# Patient Record
Sex: Female | Born: 1990 | Race: White | Hispanic: No | Marital: Married | Smoking: Never smoker
Health system: Southern US, Community
[De-identification: ages and names within clinical notes are randomized; demographics above are authoritative.]

## PROBLEM LIST (undated history)

## (undated) ENCOUNTER — Inpatient Hospital Stay (HOSPITAL_COMMUNITY): Payer: Self-pay

## (undated) DIAGNOSIS — K589 Irritable bowel syndrome without diarrhea: Secondary | ICD-10-CM

## (undated) DIAGNOSIS — N809 Endometriosis, unspecified: Secondary | ICD-10-CM

## (undated) DIAGNOSIS — E7212 Methylenetetrahydrofolate reductase deficiency: Secondary | ICD-10-CM

## (undated) DIAGNOSIS — E039 Hypothyroidism, unspecified: Secondary | ICD-10-CM

## (undated) DIAGNOSIS — A6920 Lyme disease, unspecified: Secondary | ICD-10-CM

## (undated) DIAGNOSIS — Z1589 Genetic susceptibility to other disease: Secondary | ICD-10-CM

## (undated) DIAGNOSIS — L709 Acne, unspecified: Secondary | ICD-10-CM

## (undated) HISTORY — DX: Acne, unspecified: L70.9

## (undated) HISTORY — PX: OTHER SURGICAL HISTORY: SHX169

## (undated) HISTORY — DX: Endometriosis, unspecified: N80.9

## (undated) HISTORY — DX: Hypothyroidism, unspecified: E03.9

## (undated) HISTORY — PX: APPENDECTOMY: SHX54

## (undated) HISTORY — DX: Irritable bowel syndrome, unspecified: K58.9

## (undated) HISTORY — PX: TONSILLECTOMY: SUR1361

## (undated) HISTORY — PX: CHOLECYSTECTOMY: SHX55

---

## 2011-01-14 ENCOUNTER — Other Ambulatory Visit: Payer: Self-pay | Admitting: Gastroenterology

## 2011-01-18 ENCOUNTER — Ambulatory Visit
Admission: RE | Admit: 2011-01-18 | Discharge: 2011-01-18 | Disposition: A | Discharge status: Home / Self Care | Payer: BC Managed Care – PPO | Source: Ambulatory Visit | Attending: Gastroenterology | Admitting: Gastroenterology

## 2011-01-18 MED ORDER — IOHEXOL 300 MG/ML  SOLN
100.0000 mL | Freq: Once | INTRAMUSCULAR | Status: AC | PRN
  Administered 2011-01-18: 100 mL via INTRAVENOUS

## 2012-01-13 ENCOUNTER — Emergency Department (HOSPITAL_COMMUNITY): Payer: BC Managed Care – PPO

## 2012-01-13 ENCOUNTER — Emergency Department (HOSPITAL_COMMUNITY)
Admission: EM | Admit: 2012-01-13 | Discharge: 2012-01-13 | Disposition: A | Discharge status: Home Health | Payer: BC Managed Care – PPO | Attending: Emergency Medicine | Admitting: Emergency Medicine

## 2012-01-13 ENCOUNTER — Encounter (HOSPITAL_COMMUNITY): Payer: Self-pay | Admitting: Emergency Medicine

## 2012-01-13 DIAGNOSIS — R112 Nausea with vomiting, unspecified: Secondary | ICD-10-CM | POA: Insufficient documentation

## 2012-01-13 DIAGNOSIS — R1011 Right upper quadrant pain: Secondary | ICD-10-CM | POA: Insufficient documentation

## 2012-01-13 DIAGNOSIS — N390 Urinary tract infection, site not specified: Secondary | ICD-10-CM

## 2012-01-13 DIAGNOSIS — R109 Unspecified abdominal pain: Secondary | ICD-10-CM | POA: Insufficient documentation

## 2012-01-13 DIAGNOSIS — N200 Calculus of kidney: Secondary | ICD-10-CM

## 2012-01-13 LAB — DIFFERENTIAL
Basophils Relative: 0 % (ref 0–1)
Eosinophils Absolute: 0 10*3/uL (ref 0.0–0.7)
Eosinophils Relative: 0 % (ref 0–5)
Lymphs Abs: 2.2 10*3/uL (ref 0.7–4.0)
Monocytes Absolute: 0.7 10*3/uL (ref 0.1–1.0)
Monocytes Relative: 7 % (ref 3–12)
Neutrophils Relative %: 72 % (ref 43–77)

## 2012-01-13 LAB — COMPREHENSIVE METABOLIC PANEL
Alkaline Phosphatase: 91 U/L (ref 39–117)
BUN: 8 mg/dL (ref 6–23)
Creatinine, Ser: 0.72 mg/dL (ref 0.50–1.10)
GFR calc Af Amer: >90 mL/min (ref >90)
Glucose, Bld: 147 mg/dL — ABNORMAL HIGH (ref 70–99)
Potassium: 3.4 mEq/L — ABNORMAL LOW (ref 3.5–5.1)
Total Bilirubin: 0.7 mg/dL (ref 0.3–1.2)
Total Protein: 7.4 g/dL (ref 6.0–8.3)

## 2012-01-13 LAB — URINALYSIS, ROUTINE W REFLEX MICROSCOPIC
Glucose, UA: NEGATIVE mg/dL
Protein, ur: NEGATIVE mg/dL
Specific Gravity, Urine: 1.023 (ref 1.005–1.030)
Urobilinogen, UA: 0.2 mg/dL (ref 0.0–1.0)

## 2012-01-13 LAB — LIPASE, BLOOD: Lipase: 36 U/L (ref 11–59)

## 2012-01-13 LAB — CBC
HCT: 39.2 % (ref 36.0–46.0)
Hemoglobin: 13.4 g/dL (ref 12.0–15.0)
MCH: 29.8 pg (ref 26.0–34.0)
MCHC: 34.2 g/dL (ref 30.0–36.0)
MCV: 87.1 fL (ref 78.0–100.0)
RBC: 4.5 MIL/uL (ref 3.87–5.11)

## 2012-01-13 LAB — URINE MICROSCOPIC-ADD ON

## 2012-01-13 LAB — PREGNANCY, URINE: Preg Test, Ur: NEGATIVE

## 2012-01-13 MED ORDER — SODIUM CHLORIDE 0.9 % IV SOLN
1000.0000 mL | Freq: Once | INTRAVENOUS | Status: AC
  Administered 2012-01-13: 1000 mL via INTRAVENOUS

## 2012-01-13 MED ORDER — HYDROCODONE-ACETAMINOPHEN 5-325 MG PO TABS: 1.0000 | ORAL_TABLET | Freq: Four times a day (QID) | ORAL | Status: AC | PRN

## 2012-01-13 MED ORDER — CIPROFLOXACIN IN D5W 400 MG/200ML IV SOLN
400.0000 mg | Freq: Two times a day (BID) | INTRAVENOUS | Status: DC
  Administered 2012-01-13: 400 mg via INTRAVENOUS
  Filled 2012-01-13: qty 200

## 2012-01-13 MED ORDER — SODIUM CHLORIDE 0.9 % IV SOLN
1000.0000 mL | INTRAVENOUS | Status: DC
  Administered 2012-01-13: 1000 mL via INTRAVENOUS

## 2012-01-13 MED ORDER — ONDANSETRON HCL 4 MG/2ML IJ SOLN
4.0000 mg | Freq: Once | INTRAMUSCULAR | Status: AC
  Administered 2012-01-13: 4 mg via INTRAVENOUS
  Filled 2012-01-13: qty 2

## 2012-01-13 MED ORDER — ONDANSETRON 8 MG PO TBDP: 8.0000 mg | ORAL_TABLET | Freq: Three times a day (TID) | ORAL | Status: AC | PRN

## 2012-01-13 MED ORDER — CIPROFLOXACIN HCL 500 MG PO TABS: 500.0000 mg | ORAL_TABLET | Freq: Two times a day (BID) | ORAL | Status: AC

## 2012-01-13 MED ORDER — KETOROLAC TROMETHAMINE 30 MG/ML IJ SOLN
30.0000 mg | Freq: Once | INTRAMUSCULAR | Status: AC
  Administered 2012-01-13: 30 mg via INTRAVENOUS
  Filled 2012-01-13: qty 1

## 2012-01-13 MED ORDER — HYDROMORPHONE HCL PF 1 MG/ML IJ SOLN
1.0000 mg | Freq: Once | INTRAMUSCULAR | Status: AC
Start: 2012-01-13 — End: 2012-01-13
  Administered 2012-01-13: 1 mg via INTRAVENOUS
  Filled 2012-01-13: qty 1

## 2012-01-13 NOTE — Discharge Instructions (Signed)
Kidney Stones Kidney stones (ureteral lithiasis) are deposits that form inside your kidneys. The intense pain is caused by the stone moving through the urinary tract. When the stone moves, the ureter goes into spasm around the stone. The stone is usually passed in the urine.  CAUSES   A disorder that makes certain neck glands produce too much parathyroid hormone (primary hyperparathyroidism).   A buildup of uric acid crystals.   Narrowing (stricture) of the ureter.   A kidney obstruction present at birth (congenital obstruction).   Previous surgery on the kidney or ureters.   Numerous kidney infections.  SYMPTOMS   Feeling sick to your stomach (nauseous).   Throwing up (vomiting).   Blood in the urine (hematuria).   Pain that usually spreads (radiates) to the groin.   Frequency or urgency of urination.  DIAGNOSIS   Taking a history and physical exam.   Blood or urine tests.   Computerized X-ray scan (CT scan).   Occasionally, an examination of the inside of the urinary bladder (cystoscopy) is performed.  TREATMENT   Observation.   Increasing your fluid intake.   Surgery may be needed if you have severe pain or persistent obstruction.  The size, location, and chemical composition are all important variables that will determine the proper choice of action for you. Talk to your caregiver to better understand your situation so that you will minimize the risk of injury to yourself and your kidney.  HOME CARE INSTRUCTIONS   Drink enough water and fluids to keep your urine clear or pale yellow.   Strain all urine through the provided strainer. Keep all particulate matter and stones for your caregiver to see. The stone causing the pain may be as small as a grain of salt. It is very important to use the strainer each and every time you pass your urine. The collection of your stone will allow your caregiver to analyze it and verify that a stone has actually passed.   Only take  over-the-counter or prescription medicines for pain, discomfort, or fever as directed by your caregiver.   Make a follow-up appointment with your caregiver as directed.   Get follow-up X-rays if required. The absence of pain does not always mean that the stone has passed. It may have only stopped moving. If the urine remains completely obstructed, it can cause loss of kidney function or even complete destruction of the kidney. It is your responsibility to make sure X-rays and follow-ups are completed. Ultrasounds of the kidney can show blockages and the status of the kidney. Ultrasounds are not associated with any radiation and can be performed easily in a matter of minutes.  SEEK IMMEDIATE MEDICAL CARE IF:   Pain cannot be controlled with the prescribed medicine.   You have a fever.   The severity or intensity of pain increases over 18 hours and is not relieved by pain medicine.   You develop a new onset of abdominal pain.   You feel faint or pass out.  MAKE SURE YOU:   Understand these instructions.   Will watch your condition.   Will get help right away if you are not doing well or get worse.  Document Released: 11/07/2005 Document Revised: 07/20/2011 Document Reviewed: 03/05/2010 [facility] Patient Information 2012 [place], [place].Urinary Tract Infection Infections of the urinary tract can start in several places. A bladder infection (cystitis), a kidney infection (pyelonephritis), and a prostate infection (prostatitis) are different types of urinary tract infections (UTIs). They usually get better if  treated with medicines (antibiotics) that kill germs. Take all the medicine until it is gone. You or your child may feel better in a few days, but TAKE ALL MEDICINE or the infection may not respond and may become more difficult to treat. HOME CARE INSTRUCTIONS   Drink enough water and fluids to keep the urine clear or pale yellow. Cranberry juice is especially recommended, in addition to  large amounts of water.   Avoid caffeine, tea, and carbonated beverages. They tend to irritate the bladder.   Alcohol may irritate the prostate.   Only take over-the-counter or prescription medicines for pain, discomfort, or fever as directed by your caregiver.  To prevent further infections:  Empty the bladder often. Avoid holding urine for long periods of time.   After a bowel movement, women should cleanse from front to back. Use each tissue only once.   Empty the bladder before and after sexual intercourse.  FINDING OUT THE RESULTS OF YOUR TEST Not all test results are available during your visit. If your or your child's test results are not back during the visit, make an appointment with your caregiver to find out the results. Do not assume everything is normal if you have not heard from your caregiver or the medical facility. It is important for you to follow up on all test results. SEEK MEDICAL CARE IF:   There is back pain.   Your baby is older than 3 months with a rectal temperature of 100.5 F (38.1 C) or higher for more than 1 day.   Your or your child's problems (symptoms) are no better in 3 days. Return sooner if you or your child is getting worse.  SEEK IMMEDIATE MEDICAL CARE IF:   There is severe back pain or lower abdominal pain.   You or your child develops chills.   You have a fever.   Your baby is older than 3 months with a rectal temperature of 102 F (38.9 C) or higher.   Your baby is 70 months old or younger with a rectal temperature of 100.4 F (38 C) or higher.   There is nausea or vomiting.   There is continued burning or discomfort with urination.  MAKE SURE YOU:   Understand these instructions.   Will watch your condition.   Will get help right away if you are not doing well or get worse.  Document Released: 08/17/2005 Document Revised: 07/20/2011 Document Reviewed: 03/22/2007 [facility] Patient Information 2012 [place], [place].

## 2012-01-13 NOTE — ED Provider Notes (Signed)
History     CSN: [id number]  Arrival date & time 01/13/12  7829   First MD Initiated Contact with Patient 01/13/12 0535      Chief Complaint  Patient presents with  . Abdominal Pain    (Consider location/radiation/quality/duration/timing/severity/associated sxs/prior treatment) HPI Patient presents to the emergency room with complaints of severe right flank pain. Patient states it started suddenly about 1 AM. She has been having nausea and vomiting associated with it. Pain radiates towards her right lower abdomen. She denies any diarrhea or constipation. She denies any dysuria, vaginal bleeding or discharge. The patient denies history of kidney stones. She does have history of a cholecystectomy in the past. Patient denies any recent injuries. She denies any chest for shortness of breath. The pain seems to be worse when she tries to lie flat. No past medical history on file.  Past Surgical History  Procedure Date  . Tonsillectomy   . Cholecystectomy     No family history on file.  History  Substance Use Topics  . Smoking status: Not on file  . Smokeless tobacco: Not on file  . Alcohol Use:     OB History    Grav Para Term Preterm Abortions TAB SAB Ect Mult Living                  Review of Systems  All other systems reviewed and are negative.    Allergies  Bactrim; Keflex; Penicillins; and Zithromax  Home Medications  No current outpatient prescriptions on file.  BP 130/94  Pulse 72  Resp 18  SpO2 95%  LMP 01/06/2012  Physical Exam  Nursing note and vitals reviewed. Constitutional: She appears well-developed and well-nourished. She appears distressed.  HENT:  Head: Normocephalic and atraumatic.  Right Ear: External ear normal.  Left Ear: External ear normal.  Eyes: Conjunctivae are normal. Right eye exhibits no discharge. Left eye exhibits no discharge. No scleral icterus.  Neck: Neck supple. No tracheal deviation present.  Cardiovascular: Normal rate,  regular rhythm and intact distal pulses.   Pulmonary/Chest: Effort normal and breath sounds normal. No stridor. No respiratory distress. She has no wheezes. She has no rales.  Abdominal: Soft. Bowel sounds are normal. She exhibits no distension. There is tenderness in the right upper quadrant. There is CVA tenderness. There is no rigidity, no rebound, no guarding and negative Murphy's sign. No hernia.  Musculoskeletal: She exhibits no edema and no tenderness.  Neurological: She is alert. She has normal strength. No sensory deficit. Cranial nerve deficit:  no gross defecits noted. She exhibits normal muscle tone. She displays no seizure activity. Coordination normal.  Skin: Skin is warm and dry. No rash noted.  Psychiatric: She has a normal mood and affect.    ED Course  Procedures (including critical care time)  Medications  0.9 %  sodium chloride infusion (1000 mL Intravenous New Bag/Given 01/13/12 0553)    Followed by  0.9 %  sodium chloride infusion (1000 mL Intravenous New Bag/Given 01/13/12 0622)  Multiple Vitamin (MULITIVITAMIN WITH MINERALS) TABS (not administered)  ciprofloxacin (CIPRO) IVPB 400 mg (400 mg Intravenous Given 01/13/12 0640)  ketorolac (TORADOL) 30 MG/ML injection 30 mg (not administered)  HYDROcodone-acetaminophen (NORCO) 5-325 MG per tablet (not administered)  ondansetron (ZOFRAN ODT) 8 MG disintegrating tablet (not administered)  ciprofloxacin (CIPRO) 500 MG tablet (not administered)  HYDROmorphone (DILAUDID) injection 1 mg (1 mg Intravenous Given 01/13/12 0553)  ondansetron (ZOFRAN) injection 4 mg (4 mg Intravenous Given 01/13/12 0556)  Labs Reviewed  URINALYSIS, ROUTINE W REFLEX MICROSCOPIC - Abnormal; Notable for the following:    APPearance CLOUDY (*)    Hgb urine dipstick SMALL (*)    Leukocytes, UA LARGE (*)    All other components within normal limits  COMPREHENSIVE METABOLIC PANEL - Abnormal; Notable for the following:    Potassium 3.4 (*)    Glucose,  Bld 147 (*)    All other components within normal limits  URINE MICROSCOPIC-ADD ON - Abnormal; Notable for the following:    Squamous Epithelial / LPF MANY (*)    Bacteria, UA MANY (*)    All other components within normal limits  CBC  DIFFERENTIAL  LIPASE, BLOOD  PREGNANCY, URINE  URINE CULTURE   Ct Abdomen Pelvis Wo Contrast  01/13/2012  *RADIOLOGY REPORT*  Clinical Data:  Right-sided flank pain.  White blood cells and red blood cells in urine.  Cholecystectomy and 2 laparoscopies for endometriosis.  CT ABDOMEN AND PELVIS WITHOUT CONTRAST (CT UROGRAM)  Technique: Contiguous axial images of the abdomen and pelvis without oral or intravenous contrast were obtained.  Comparison: 01/18/2011  Findings:  Exam is limited for evaluation of entities other than urinary tract calculi due to lack of oral or intravenous contrast.   Clear lung bases.  Normal heart size without pericardial or pleural effusion.  Moderate pectus excavatum deformity.  Normal uninfused appearance of the liver, spleen, stomach, pancreas. Cholecystectomy without biliary ductal dilatation.  Normal adrenal glands.  No renal calculi.  There is mild right renal edema and minimal right hydroureter.  No ureteric stone is identified.  No retroperitoneal or retrocrural adenopathy.  Moderate stool within the rectum.  Stool filled terminal ileum.  Normal appendix. Normal small bowel without abdominal ascites.    No pelvic adenopathy.  2 mm stone in the left sided dependent bladder on image 78.  Cervical birth control device.  No adnexal mass or significant free pelvic fluid. No acute osseous abnormality.  IMPRESSION: 2 ml stone in the dependent left bladder.  Given right renal edema and mild right hydroureter, likely recently passed from the right urinary tract.  Possible constipation.  Original Report Authenticated By: Consuello Bossier, M.D.     1. Kidney stone   2. UTI (lower urinary tract infection)       MDM  The patient is more  comfortable at this time after pain medications and antiemetics. Urinalysis suggests urinary tract infection although she does have squamous epithelial cells in the urine sample.. The patient however had very colicky abdominal pain. Symptoms are suspicious for kidney stones. I ordered a CT abdomen and pelvis non-contrast.  If negative I will plan on discharging the patient home on pain medications and antibiotics.  CT scan suggests the patient recently passed a right ureteral stone. This would be consistent with the symptoms that she had. Findings have been discussed with the patient and her family. She'll be discharged home her on a course of antibiotics and pain medication      Celene Kras, MD 01/13/12 [id number]

## 2012-01-13 NOTE — ED Notes (Signed)
MD at bedside. 

## 2012-01-13 NOTE — ED Notes (Signed)
Pt reports sudden right flank pain that started suddenly at 1am. Pt reports nausea and vomiting and rates pain 10/10.

## 2012-01-15 LAB — URINE CULTURE: Culture  Setup Time: 201302221025

## 2012-01-16 NOTE — ED Notes (Signed)
+   urine Patient treated with Cipro-sensitive to same-chart appended per protocol MD. 

## 2012-08-25 IMAGING — CT CT ABD-PELV W/O CM
1 series · 13 of 15 positions shown, 19 images · non-contrast
Comparison: 01/18/2011

CLINICAL DATA: Right-sided flank pain.  White blood cells and red
blood cells in urine.  Cholecystectomy and 2 laparoscopies for
endometriosis.

CT ABDOMEN AND PELVIS WITHOUT CONTRAST (CT UROGRAM)
TECHNIQUE: Contiguous axial images of the abdomen and pelvis
without oral or intravenous contrast were obtained.

[Series 4: lung · axial · 0.74mm/px · z∈[+146,+206]mm · 13 of 15 slices shown, 19 images]
[im 2/15  soft-tissue]
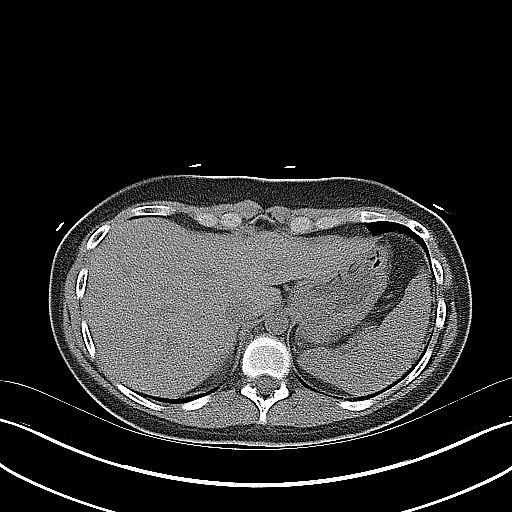
[im 2/15  bone]
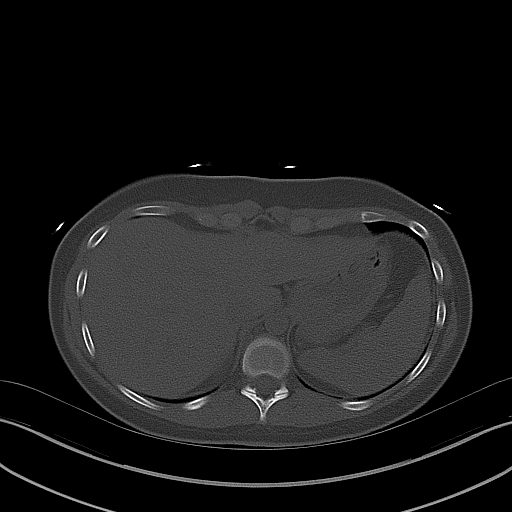
[im 3/15  soft-tissue]
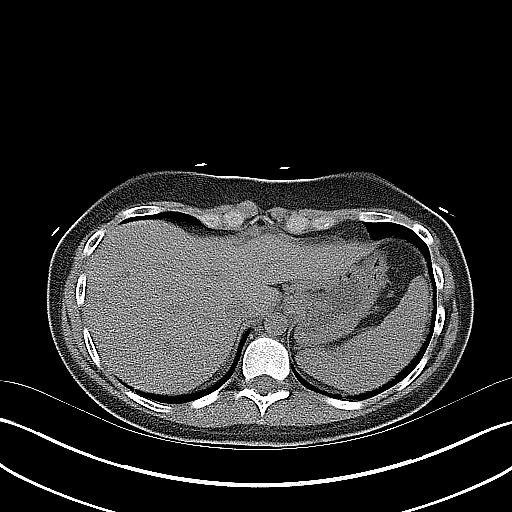
[im 4/15  soft-tissue]
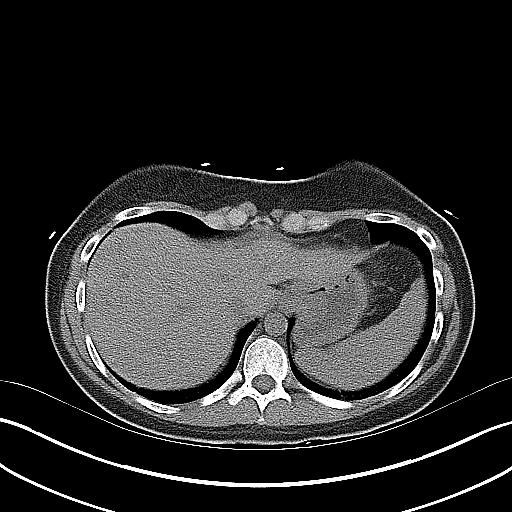
[im 5/15  soft-tissue]
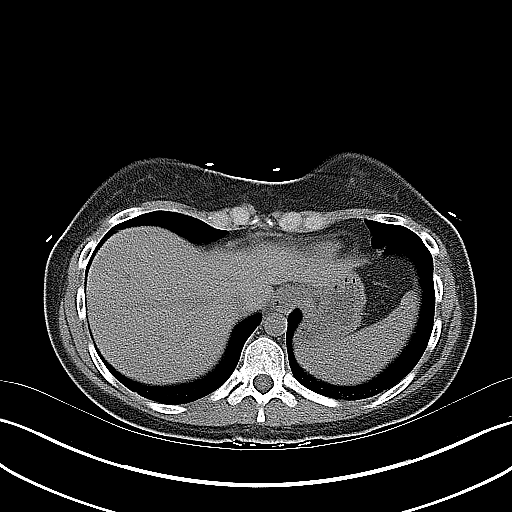
[im 6/15  soft-tissue]
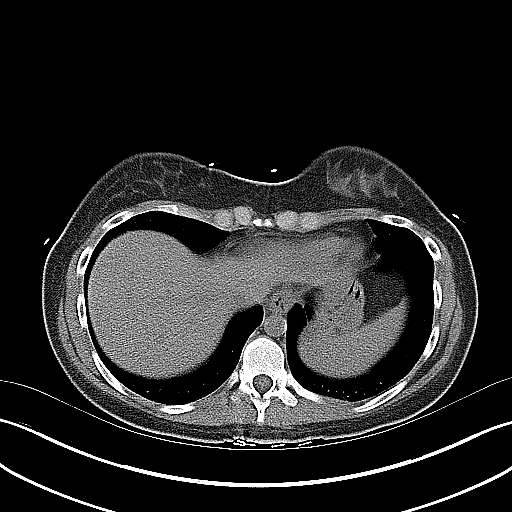
[im 7/15  soft-tissue]
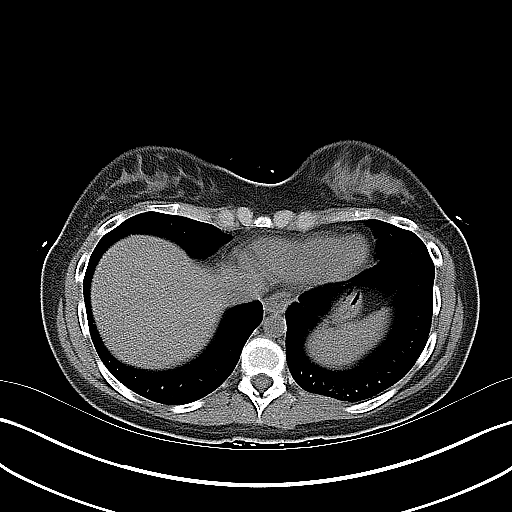
[im 8/15  soft-tissue]
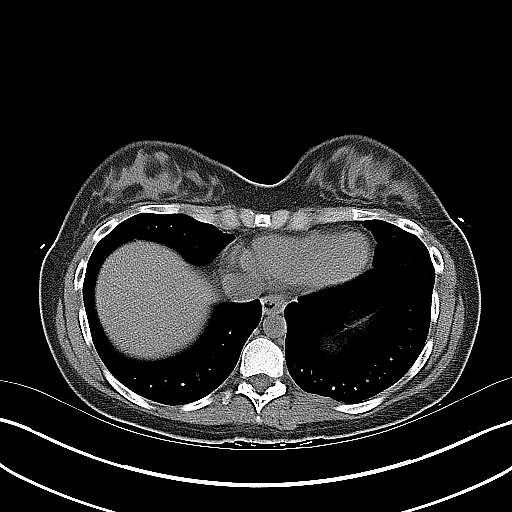
[im 9/15  soft-tissue]
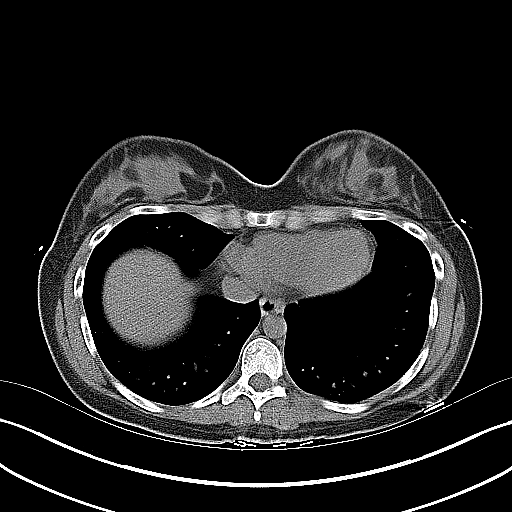
[im 10/15  soft-tissue]
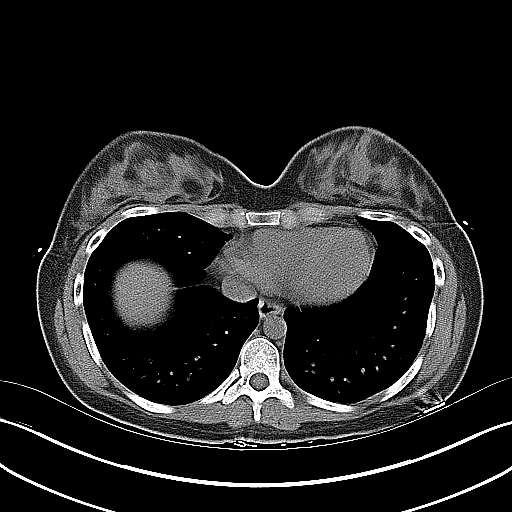
[im 10/15  bone]
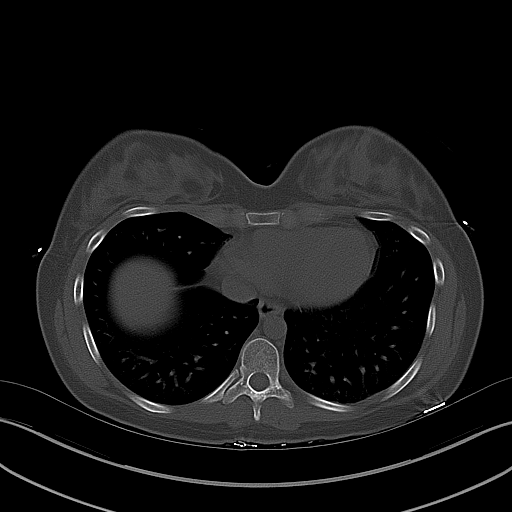
[im 11/15  soft-tissue]
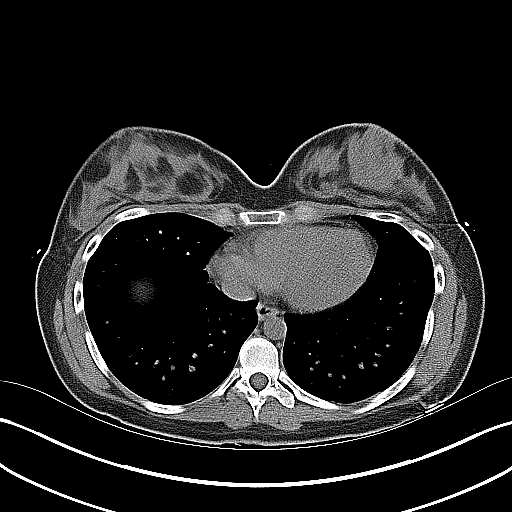
[im 11/15  lung]
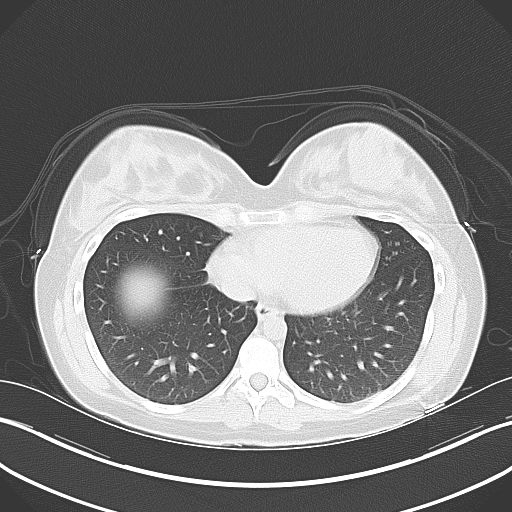
[im 12/15  soft-tissue]
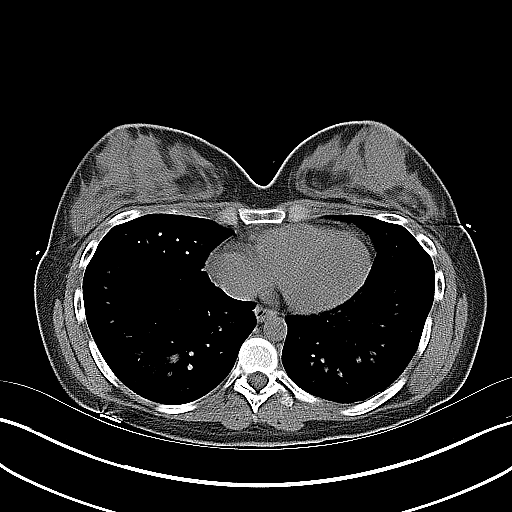
[im 12/15  lung]
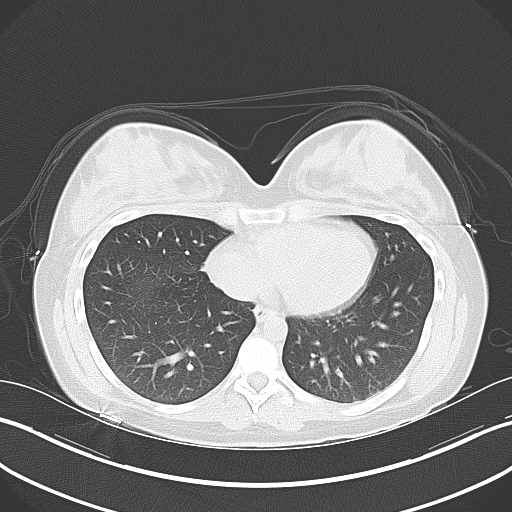
[im 13/15  soft-tissue]
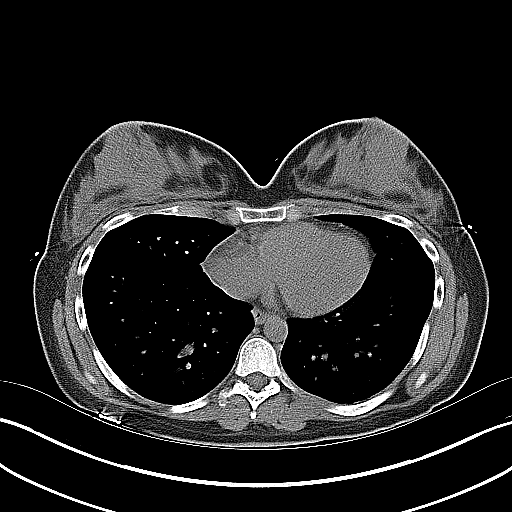
[im 13/15  lung]
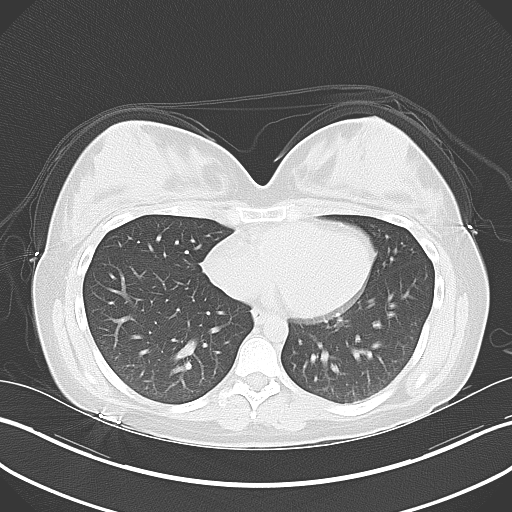
[im 14/15  soft-tissue]
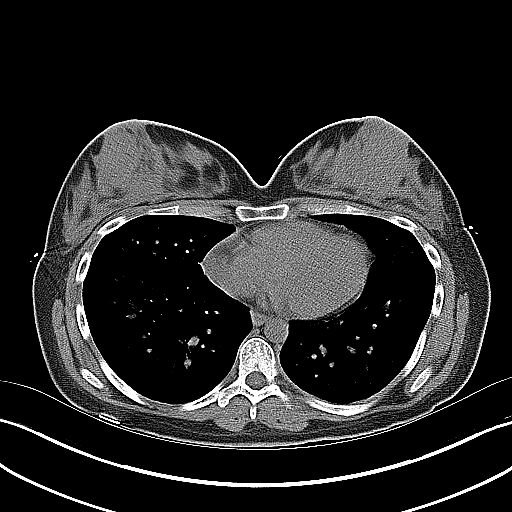
[im 14/15  lung]
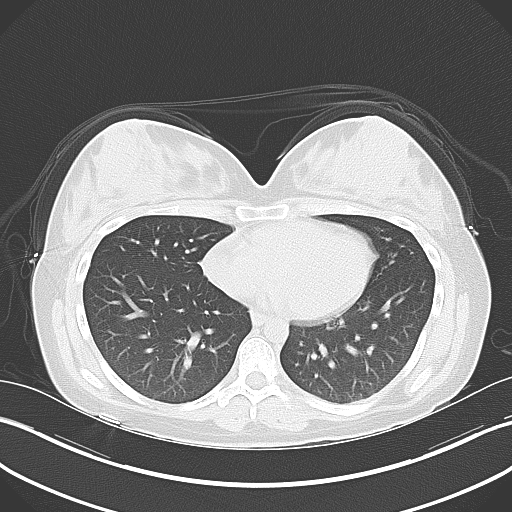

[13 of 15 positions shown; findings below may reference images not displayed]

FINDINGS: Exam is limited for evaluation of entities other than
urinary tract calculi due to lack of oral or intravenous contrast.

 Clear lung bases.  Normal heart size without pericardial or
pleural effusion.  Moderate pectus excavatum deformity.  Normal
uninfused appearance of the liver, spleen, stomach, pancreas.
Cholecystectomy without biliary ductal dilatation.  Normal adrenal
glands.  No renal calculi.  There is mild right renal edema and
minimal right hydroureter.  No ureteric stone is identified.

No retroperitoneal or retrocrural adenopathy.  Moderate stool
within the rectum.  Stool filled terminal ileum.  Normal appendix.
Normal small bowel without abdominal ascites.

  No pelvic adenopathy.  2 mm stone in the left sided dependent
bladder on image 78.  Cervical birth control device.  No adnexal
mass or significant free pelvic fluid. No acute osseous
abnormality.
IMPRESSION: 2 ml stone in the dependent left bladder.  Given right renal edema
and mild right hydroureter, likely recently passed from the right
urinary tract.

Possible constipation.

## 2013-01-22 ENCOUNTER — Other Ambulatory Visit (HOSPITAL_COMMUNITY)
Admission: RE | Admit: 2013-01-22 | Discharge: 2013-01-22 | Disposition: A | Discharge status: Home / Self Care | Payer: BC Managed Care – PPO | Source: Ambulatory Visit | Attending: Obstetrics and Gynecology | Admitting: Obstetrics and Gynecology

## 2013-01-22 ENCOUNTER — Other Ambulatory Visit: Payer: Self-pay | Admitting: Nurse Practitioner

## 2013-01-22 DIAGNOSIS — Z01419 Encounter for gynecological examination (general) (routine) without abnormal findings: Secondary | ICD-10-CM | POA: Insufficient documentation

## 2015-05-18 ENCOUNTER — Other Ambulatory Visit: Payer: Self-pay | Admitting: Obstetrics & Gynecology

## 2015-05-20 LAB — CYTOLOGY - PAP

## 2015-12-03 ENCOUNTER — Ambulatory Visit: Payer: Self-pay | Admitting: Infectious Disease

## 2015-12-03 ENCOUNTER — Other Ambulatory Visit: Payer: Self-pay | Admitting: *Deleted

## 2015-12-03 ENCOUNTER — Encounter: Payer: Self-pay | Admitting: *Deleted

## 2016-02-23 DIAGNOSIS — N809 Endometriosis, unspecified: Secondary | ICD-10-CM | POA: Diagnosis not present

## 2016-02-23 DIAGNOSIS — A6920 Lyme disease, unspecified: Secondary | ICD-10-CM | POA: Diagnosis not present

## 2016-02-23 DIAGNOSIS — R5383 Other fatigue: Secondary | ICD-10-CM | POA: Diagnosis not present

## 2016-03-15 DIAGNOSIS — E039 Hypothyroidism, unspecified: Secondary | ICD-10-CM | POA: Diagnosis not present

## 2016-03-15 DIAGNOSIS — Z3149 Encounter for other procreative investigation and testing: Secondary | ICD-10-CM | POA: Diagnosis not present

## 2016-03-15 DIAGNOSIS — Z3009 Encounter for other general counseling and advice on contraception: Secondary | ICD-10-CM | POA: Diagnosis not present

## 2016-03-15 DIAGNOSIS — Z0184 Encounter for antibody response examination: Secondary | ICD-10-CM | POA: Diagnosis not present

## 2016-03-15 DIAGNOSIS — Z30432 Encounter for removal of intrauterine contraceptive device: Secondary | ICD-10-CM | POA: Diagnosis not present

## 2016-03-15 DIAGNOSIS — Z3169 Encounter for other general counseling and advice on procreation: Secondary | ICD-10-CM | POA: Diagnosis not present

## 2016-05-20 DIAGNOSIS — Z6830 Body mass index (BMI) 30.0-30.9, adult: Secondary | ICD-10-CM | POA: Diagnosis not present

## 2016-05-20 DIAGNOSIS — Z01419 Encounter for gynecological examination (general) (routine) without abnormal findings: Secondary | ICD-10-CM | POA: Diagnosis not present

## 2016-06-20 DIAGNOSIS — N946 Dysmenorrhea, unspecified: Secondary | ICD-10-CM | POA: Diagnosis not present

## 2016-06-20 DIAGNOSIS — E039 Hypothyroidism, unspecified: Secondary | ICD-10-CM | POA: Diagnosis not present

## 2016-06-20 DIAGNOSIS — N951 Menopausal and female climacteric states: Secondary | ICD-10-CM | POA: Diagnosis not present

## 2016-09-02 DIAGNOSIS — O208 Other hemorrhage in early pregnancy: Secondary | ICD-10-CM | POA: Diagnosis not present

## 2016-09-02 DIAGNOSIS — O200 Threatened abortion: Secondary | ICD-10-CM | POA: Diagnosis not present

## 2016-09-02 DIAGNOSIS — B373 Candidiasis of vulva and vagina: Secondary | ICD-10-CM | POA: Diagnosis not present

## 2016-09-02 DIAGNOSIS — Z3A01 Less than 8 weeks gestation of pregnancy: Secondary | ICD-10-CM | POA: Diagnosis not present

## 2016-09-05 DIAGNOSIS — Z369 Encounter for antenatal screening, unspecified: Secondary | ICD-10-CM | POA: Diagnosis not present

## 2016-09-22 DIAGNOSIS — N911 Secondary amenorrhea: Secondary | ICD-10-CM | POA: Diagnosis not present

## 2016-09-29 DIAGNOSIS — Z3685 Encounter for antenatal screening for Streptococcus B: Secondary | ICD-10-CM | POA: Diagnosis not present

## 2016-09-29 DIAGNOSIS — O99281 Endocrine, nutritional and metabolic diseases complicating pregnancy, first trimester: Secondary | ICD-10-CM | POA: Diagnosis not present

## 2016-09-29 DIAGNOSIS — Z3401 Encounter for supervision of normal first pregnancy, first trimester: Secondary | ICD-10-CM | POA: Diagnosis not present

## 2016-09-29 LAB — OB RESULTS CONSOLE RPR: RPR: NONREACTIVE

## 2016-09-29 LAB — OB RESULTS CONSOLE GC/CHLAMYDIA
CHLAMYDIA, DNA PROBE: NEGATIVE
GC PROBE AMP, GENITAL: NEGATIVE

## 2016-09-29 LAB — OB RESULTS CONSOLE HIV ANTIBODY (ROUTINE TESTING): HIV: NONREACTIVE

## 2016-09-29 LAB — OB RESULTS CONSOLE HEPATITIS B SURFACE ANTIGEN: HEP B S AG: NEGATIVE

## 2016-09-29 LAB — OB RESULTS CONSOLE RUBELLA ANTIBODY, IGM: RUBELLA: IMMUNE

## 2016-10-05 ENCOUNTER — Encounter (HOSPITAL_COMMUNITY): Payer: Self-pay | Admitting: *Deleted

## 2016-10-05 ENCOUNTER — Inpatient Hospital Stay (HOSPITAL_COMMUNITY)
Admission: AD | Admit: 2016-10-05 | Discharge: 2016-10-05 | Disposition: A | Discharge status: Home / Self Care | Payer: BLUE CROSS/BLUE SHIELD | Source: Ambulatory Visit | Attending: Obstetrics and Gynecology | Admitting: Obstetrics and Gynecology

## 2016-10-05 DIAGNOSIS — O99281 Endocrine, nutritional and metabolic diseases complicating pregnancy, first trimester: Secondary | ICD-10-CM | POA: Insufficient documentation

## 2016-10-05 DIAGNOSIS — Z3A10 10 weeks gestation of pregnancy: Secondary | ICD-10-CM | POA: Insufficient documentation

## 2016-10-05 DIAGNOSIS — E039 Hypothyroidism, unspecified: Secondary | ICD-10-CM | POA: Insufficient documentation

## 2016-10-05 DIAGNOSIS — K589 Irritable bowel syndrome without diarrhea: Secondary | ICD-10-CM | POA: Insufficient documentation

## 2016-10-05 DIAGNOSIS — Z79899 Other long term (current) drug therapy: Secondary | ICD-10-CM | POA: Diagnosis not present

## 2016-10-05 DIAGNOSIS — O219 Vomiting of pregnancy, unspecified: Secondary | ICD-10-CM | POA: Diagnosis not present

## 2016-10-05 DIAGNOSIS — O99611 Diseases of the digestive system complicating pregnancy, first trimester: Secondary | ICD-10-CM | POA: Insufficient documentation

## 2016-10-05 HISTORY — DX: Lyme disease, unspecified: A69.20

## 2016-10-05 LAB — CBC
HEMATOCRIT: 38.2 % (ref 36.0–46.0)
HEMOGLOBIN: 13.1 g/dL (ref 12.0–15.0)
MCH: 29.8 pg (ref 26.0–34.0)
MCHC: 34.3 g/dL (ref 30.0–36.0)
MCV: 87 fL (ref 78.0–100.0)
Platelets: 331 10*3/uL (ref 150–400)
RBC: 4.39 MIL/uL (ref 3.87–5.11)
RDW: 12.7 % (ref 11.5–15.5)
WBC: 8.2 10*3/uL (ref 4.0–10.5)

## 2016-10-05 LAB — COMPREHENSIVE METABOLIC PANEL
ALBUMIN: 4 g/dL (ref 3.5–5.0)
ALK PHOS: 63 U/L (ref 38–126)
ALT: 12 U/L — AB (ref 14–54)
ANION GAP: 9 (ref 5–15)
AST: 18 U/L (ref 15–41)
BILIRUBIN TOTAL: 1.3 mg/dL — AB (ref 0.3–1.2)
BUN: 8 mg/dL (ref 6–20)
CALCIUM: 9.6 mg/dL (ref 8.9–10.3)
CO2: 23 mmol/L (ref 22–32)
CREATININE: 0.52 mg/dL (ref 0.44–1.00)
Chloride: 105 mmol/L (ref 101–111)
GFR calc Af Amer: >60 mL/min (ref >60)
GFR calc non Af Amer: >60 mL/min (ref >60)
GLUCOSE: 81 mg/dL (ref 65–99)
Potassium: 3.8 mmol/L (ref 3.5–5.1)
Sodium: 137 mmol/L (ref 135–145)
TOTAL PROTEIN: 6.9 g/dL (ref 6.5–8.1)

## 2016-10-05 LAB — URINALYSIS, ROUTINE W REFLEX MICROSCOPIC
Bilirubin Urine: NEGATIVE
GLUCOSE, UA: NEGATIVE mg/dL
Ketones, ur: >80 mg/dL — AB
LEUKOCYTES UA: NEGATIVE
NITRITE: NEGATIVE
PH: 5.5 (ref 5.0–8.0)
PROTEIN: NEGATIVE mg/dL
Specific Gravity, Urine: >1.03 — ABNORMAL HIGH (ref 1.005–1.030)

## 2016-10-05 LAB — URINE MICROSCOPIC-ADD ON: WBC, UA: NONE SEEN WBC/hpf (ref 0–5)

## 2016-10-05 MED ORDER — PROMETHAZINE HCL 25 MG/ML IJ SOLN
25.0000 mg | Freq: Once | INTRAVENOUS | Status: AC
  Administered 2016-10-05: 25 mg via INTRAVENOUS
  Filled 2016-10-05: qty 1

## 2016-10-05 MED ORDER — RANITIDINE HCL 150 MG PO TABS: 150.0000 mg | ORAL_TABLET | Freq: Two times a day (BID) | ORAL | 0 refills | Status: DC

## 2016-10-05 MED ORDER — FAMOTIDINE IN NACL 20-0.9 MG/50ML-% IV SOLN
20.0000 mg | Freq: Once | INTRAVENOUS | Status: AC
  Administered 2016-10-05: 20 mg via INTRAVENOUS
  Filled 2016-10-05: qty 50

## 2016-10-05 NOTE — MAU Note (Signed)
Taking several medications for vomiting and throwing up.  Thinks she is dehydrated.  Now is also becoming constipated

## 2016-10-05 NOTE — MAU Provider Note (Signed)
History     CSN: [id number]  Arrival date and time: 10/05/16 1114   First Provider Initiated Contact with Patient 10/05/16 1158      Chief Complaint  Patient presents with  . Emesis During Pregnancy  . Constipation   HPI  Pamela Larsen is a 25 y.o. G1P0 at 1243w1d who presents with nausea & vomiting. Symptoms have been present throughout pregnancy. Currently taking diclegis TID & phenergan PRN (just switched from zofran a few days ago). Last able to take medications late night. Reports vomiting 12 times today. Attempted to eat saltines and drink water this morning but vomited. Denies fever, abdominal pain, diarrhea, heartburn, or vaginal bleeding. Last BM was a week ago. Tried to take miralax but vomited it up. Has been trying to take colace daily and sometimes able to keep that down.   OB History    Gravida Para Term Preterm AB Living   1         0   SAB TAB Ectopic Multiple Live Births                  Past Medical History:  Diagnosis Date  . Acne   . Endometriosis   . Hypothyroidism   . IBS (irritable bowel syndrome)   . Lyme disease     Past Surgical History:  Procedure Laterality Date  . APPENDECTOMY    . CHOLECYSTECTOMY    . presacral neurectomy    . TONSILLECTOMY      Family History  Problem Relation Age of Onset  . Rheum arthritis Mother     Social History  Substance Use Topics  . Smoking status: Never Smoker  . Smokeless tobacco: Never Used  . Alcohol use No    Allergies:  Allergies  Allergen Reactions  . Gluten Meal Diarrhea, Nausea And Vomiting and Swelling  . Bactrim   . Cephalexin   . Iodine   . Peanuts [Peanut Oil]   . Penicillins   . Sulfa Antibiotics   . Zithromax [Azithromycin Dihydrate]   . Iohexol Rash    Prescriptions Prior to Admission  Medication Sig Dispense Refill Last Dose  . DICLEGIS 10-10 MG TBEC TAKE 2 AT BEDTIME IF SX PERSIST TAKE 1 IN AM AND 2 AT BED IF CONT SX MAY ADD 1 IN AFTERNOON.MAX 4/D  4   . Multiple Vitamin  (MULITIVITAMIN WITH MINERALS) TABS Take 1 tablet by mouth daily.   01/12/2012 at Unknown  . ondansetron (ZOFRAN-ODT) 4 MG disintegrating tablet Take 4 mg by mouth every 8 (eight) hours as needed.  1     Review of Systems  Constitutional: Negative.   Gastrointestinal: Positive for constipation, nausea and vomiting. Negative for abdominal pain, diarrhea and heartburn.  Genitourinary: Negative.    Physical Exam   Blood pressure 115/70, pulse 85, temperature 98.4 F (36.9 C), temperature source Oral, resp. rate 16, height 5\' 7"  (1.702 m), weight 186 lb 12.8 oz (84.7 kg).  Physical Exam  Nursing note and vitals reviewed. Constitutional: She is oriented to person, place, and time. She appears well-developed and well-nourished. No distress.  HENT:  Head: Normocephalic and atraumatic.  Eyes: Conjunctivae are normal. Right eye exhibits no discharge. Left eye exhibits no discharge. No scleral icterus.  Neck: Normal range of motion.  Cardiovascular: Normal rate, regular rhythm and normal heart sounds.   No murmur heard. Respiratory: Effort normal and breath sounds normal. No respiratory distress. She has no wheezes.  GI: Soft. Bowel sounds are normal. She exhibits no  distension. There is no tenderness. There is no rebound and no guarding.  Neurological: She is alert and oriented to person, place, and time.  Skin: Skin is warm and dry. She is not diaphoretic.  Psychiatric: She has a normal mood and affect. Her behavior is normal. Judgment and thought content normal.    MAU Course  Procedures Results for orders placed or performed during the hospital encounter of 10/05/16 (from the past 24 hour(s))  Urinalysis, Routine w reflex microscopic (not at [facility])     Status: Abnormal   Collection Time: 10/05/16 11:34 AM  Result Value Ref Range   Color, Urine AMBER (A) YELLOW   APPearance CLEAR CLEAR   Specific Gravity, Urine >1.030 (H) 1.005 - 1.030   pH 5.5 5.0 - 8.0   Glucose, UA NEGATIVE NEGATIVE  mg/dL   Hgb urine dipstick TRACE (A) NEGATIVE   Bilirubin Urine NEGATIVE NEGATIVE   Ketones, ur >80 (A) NEGATIVE mg/dL   Protein, ur NEGATIVE NEGATIVE mg/dL   Nitrite NEGATIVE NEGATIVE   Leukocytes, UA NEGATIVE NEGATIVE  Urine microscopic-add on     Status: Abnormal   Collection Time: 10/05/16 11:34 AM  Result Value Ref Range   Squamous Epithelial / LPF 0-5 (A) NONE SEEN   WBC, UA NONE SEEN 0 - 5 WBC/hpf   RBC / HPF 0-5 0 - 5 RBC/hpf   Bacteria, UA FEW (A) NONE SEEN   Urine-Other MUCOUS PRESENT   CBC     Status: None   Collection Time: 10/05/16 12:24 PM  Result Value Ref Range   WBC 8.2 4.0 - 10.5 K/uL   RBC 4.39 3.87 - 5.11 MIL/uL   Hemoglobin 13.1 12.0 - 15.0 g/dL   HCT 16.1 09.6 - 04.5 %   MCV 87.0 78.0 - 100.0 fL   MCH 29.8 26.0 - 34.0 pg   MCHC 34.3 30.0 - 36.0 g/dL   RDW 40.9 81.1 - 91.4 %   Platelets 331 150 - 400 K/uL  Comprehensive metabolic panel     Status: Abnormal   Collection Time: 10/05/16 12:24 PM  Result Value Ref Range   Sodium 137 135 - 145 mmol/L   Potassium 3.8 3.5 - 5.1 mmol/L   Chloride 105 101 - 111 mmol/L   CO2 23 22 - 32 mmol/L   Glucose, Bld 81 65 - 99 mg/dL   BUN 8 6 - 20 mg/dL   Creatinine, Ser 7.82 0.44 - 1.00 mg/dL   Calcium 9.6 8.9 - 95.6 mg/dL   Total Protein 6.9 6.5 - 8.1 g/dL   Albumin 4.0 3.5 - 5.0 g/dL   AST 18 15 - 41 U/L   ALT 12 (L) 14 - 54 U/L   Alkaline Phosphatase 63 38 - 126 U/L   Total Bilirubin 1.3 (H) 0.3 - 1.2 mg/dL   GFR calc non Af Amer >60 >60 mL/min   GFR calc Af Amer >60 >60 mL/min   Anion gap 9 5 - 15    MDM U/a -- SG >1.030 & ketones >80 Phenergan 25 mg infused in bag of D5LR, pepcid 20 mg IV CBC, CMP Pt reports some improvement in symptoms & able to tolerate ice S/w Dr. Rana Snare -- ok to discharge home with rx for zantac  Assessment and Plan  A: 1. Nausea and vomiting during pregnancy prior to [redacted] weeks gestation    P: Discharge home Rx zantac Continue diclegis & phenergan as prescribed Discussed  reasons to return to MAU  Keep f/u with OB  Denny Peon  Sarissa Dern 10/05/2016, 11:58 AM

## 2016-10-05 NOTE — Discharge Instructions (Signed)
Eating Plan for Hyperemesis Gravidarum Hyperemesis gravidarum is a severe form of morning sickness. Because this condition causes severe nausea and vomiting, it can lead to dehydration, malnutrition, and weight loss. One way to lessen the symptoms of nausea and vomiting is to follow the eating plan for hyperemesis gravidarum. It is often used along with prescribed medicines to control your symptoms. What can I do to relieve my symptoms? Listen to your body. Everyone is different and has different preferences. Find what works best for you. Take any of the following actions that are helpful to you:  Eat and drink slowly.  Eat 5-6 small meals daily instead of 3 large meals.  Eat crackers before you get out of bed in the morning.  Try having a snack in the middle of the night.  Starchy foods are usually tolerated well. Examples include cereal, toast, bread, potatoes, pasta, rice, and pretzels.  Ginger may help with nausea. Add  tsp ground ginger to hot tea or choose ginger tea.  Try drinking 100% fruit juice or an electrolyte drink. An electrolyte drink contains sodium, potassium, and chloride.  Continue to take your prenatal vitamins as told by your health care provider. If you are having trouble taking your prenatal vitamins, talk with your health care provider about different options.  Include at least 1 serving of protein with your meals and snacks. Protein options include meats or poultry, beans, nuts, eggs, and yogurt. Try eating a protein-rich snack before bed. Examples of these snacks include cheese and crackers or half of a peanut butter or [place] sandwich.  Consider eliminating foods that trigger your symptoms. These may include spicy foods, coffee, high-fat foods, very sweet foods, and acidic foods.  Try meals that have more protein combined with bland, salty, lower-fat, and dry foods, such as nuts, seeds, pretzels, crackers, and cereal.  Talk with your healthcare provider about  starting a supplement of vitamin B6.  Have fluids that are cold, clear, and carbonated or sour. Examples include lemonade, ginger ale, lemon-lime soda, ice water, and sparkling water.  Try lemon or mint tea.  Try brushing your teeth or using a mouth rinse after meals. What should I avoid to reduce my symptoms? Avoiding some of the following things may help reduce your symptoms.  Foods with strong smells. Try eating meals in well-ventilated areas that are free of odors.  Drinking water or other beverages with meals. Try not to drink anything during the 30 minutes before and after your meals.  Drinking more than 1 cup of fluid at a time. Sometimes using a straw helps.  Fried or high-fat foods, such as butter and cream sauces.  Spicy foods.  Skipping meals as best as you can. Nausea can be more intense on an empty stomach. If you cannot tolerate food at that time, do not force it. Try sucking on ice chips or other frozen items, and make up for missed calories later.  Lying down within 2 hours after eating.  Environmental triggers. These may include smoky rooms, closed spaces, rooms with strong smells, warm or humid places, overly loud and noisy rooms, and rooms with motion or flickering lights.  Quick and sudden changes in your movement. This information is not intended to replace advice given to you by your health care provider. Make sure you discuss any questions you have with your health care provider. Document Released: 09/04/2007 Document Revised: 07/06/2016 Document Reviewed: 06/07/2016 Elsevier Interactive Patient Education  2017 Elsevier Inc.    Constipation, Adult Constipation  is when a person has fewer bowel movements in a week than normal, has difficulty having a bowel movement, or has stools that are dry, hard, or larger than normal. Constipation may be caused by an underlying condition. It may become worse with age if a person takes certain medicines and does not take in  enough fluids. Follow these instructions at home: Eating and drinking Eat foods that have a lot of fiber, such as fresh fruits and vegetables, whole grains, and beans. Limit foods that are high in fat, low in fiber, or overly processed, such as french fries, hamburgers, cookies, candies, and soda. Drink enough fluid to keep your urine clear or pale yellow. General instructions Exercise regularly or as told by your health care provider. Go to the restroom when you have the urge to go. Do not hold it in. Take over-the-counter and prescription medicines only as told by your health care provider. These include any fiber supplements. Practice pelvic floor retraining exercises, such as deep breathing while relaxing the lower abdomen and pelvic floor relaxation during bowel movements. Watch your condition for any changes. Keep all follow-up visits as told by your health care provider. This is important. Contact a health care provider if: You have pain that gets worse. You have a fever. You do not have a bowel movement after 4 days. You vomit. You are not hungry. You lose weight. You are bleeding from the anus. You have thin, pencil-like stools. Get help right away if: You have a fever and your symptoms suddenly get worse. You leak stool or have blood in your stool. Your abdomen is bloated. You have severe pain in your abdomen. You feel dizzy or you faint. This information is not intended to replace advice given to you by your health care provider. Make sure you discuss any questions you have with your health care provider. Document Released: 08/05/2004 Document Revised: 05/27/2016 Document Reviewed: 04/27/2016 Elsevier Interactive Patient Education  2017 [employer]

## 2016-10-10 DIAGNOSIS — Z3401 Encounter for supervision of normal first pregnancy, first trimester: Secondary | ICD-10-CM | POA: Diagnosis not present

## 2016-10-10 DIAGNOSIS — Z36 Encounter for antenatal screening for chromosomal anomalies: Secondary | ICD-10-CM | POA: Diagnosis not present

## 2016-10-24 DIAGNOSIS — Z3491 Encounter for supervision of normal pregnancy, unspecified, first trimester: Secondary | ICD-10-CM | POA: Diagnosis not present

## 2016-10-24 DIAGNOSIS — Z3682 Encounter for antenatal screening for nuchal translucency: Secondary | ICD-10-CM | POA: Diagnosis not present

## 2016-10-24 DIAGNOSIS — Z3A12 12 weeks gestation of pregnancy: Secondary | ICD-10-CM | POA: Diagnosis not present

## 2016-10-24 DIAGNOSIS — Z36 Encounter for antenatal screening for chromosomal anomalies: Secondary | ICD-10-CM | POA: Diagnosis not present

## 2016-11-02 ENCOUNTER — Other Ambulatory Visit: Payer: Self-pay | Admitting: Student

## 2016-11-02 DIAGNOSIS — Z331 Pregnant state, incidental: Secondary | ICD-10-CM | POA: Diagnosis not present

## 2016-11-02 DIAGNOSIS — R112 Nausea with vomiting, unspecified: Secondary | ICD-10-CM | POA: Diagnosis not present

## 2016-11-04 ENCOUNTER — Other Ambulatory Visit: Payer: Self-pay | Admitting: Student

## 2016-11-22 ENCOUNTER — Other Ambulatory Visit: Payer: Self-pay | Admitting: Student

## 2016-12-06 DIAGNOSIS — E039 Hypothyroidism, unspecified: Secondary | ICD-10-CM | POA: Diagnosis not present

## 2016-12-06 DIAGNOSIS — Z3480 Encounter for supervision of other normal pregnancy, unspecified trimester: Secondary | ICD-10-CM | POA: Diagnosis not present

## 2016-12-06 DIAGNOSIS — Z363 Encounter for antenatal screening for malformations: Secondary | ICD-10-CM | POA: Diagnosis not present

## 2017-01-03 DIAGNOSIS — N760 Acute vaginitis: Secondary | ICD-10-CM | POA: Diagnosis not present

## 2017-01-30 DIAGNOSIS — Z3480 Encounter for supervision of other normal pregnancy, unspecified trimester: Secondary | ICD-10-CM | POA: Diagnosis not present

## 2017-02-13 DIAGNOSIS — O360920 Maternal care for other rhesus isoimmunization, second trimester, not applicable or unspecified: Secondary | ICD-10-CM | POA: Diagnosis not present

## 2017-02-13 DIAGNOSIS — Z3A28 28 weeks gestation of pregnancy: Secondary | ICD-10-CM | POA: Diagnosis not present

## 2017-02-13 DIAGNOSIS — O358XX0 Maternal care for other (suspected) fetal abnormality and damage, not applicable or unspecified: Secondary | ICD-10-CM | POA: Diagnosis not present

## 2017-03-17 DIAGNOSIS — Z3482 Encounter for supervision of other normal pregnancy, second trimester: Secondary | ICD-10-CM | POA: Diagnosis not present

## 2017-03-17 DIAGNOSIS — Z3483 Encounter for supervision of other normal pregnancy, third trimester: Secondary | ICD-10-CM | POA: Diagnosis not present

## 2017-03-20 ENCOUNTER — Ambulatory Visit: Payer: BLUE CROSS/BLUE SHIELD | Admitting: Cardiovascular Disease

## 2017-03-27 DIAGNOSIS — O26893 Other specified pregnancy related conditions, third trimester: Secondary | ICD-10-CM | POA: Diagnosis not present

## 2017-03-27 DIAGNOSIS — R1011 Right upper quadrant pain: Secondary | ICD-10-CM | POA: Diagnosis not present

## 2017-03-27 DIAGNOSIS — E039 Hypothyroidism, unspecified: Secondary | ICD-10-CM | POA: Diagnosis not present

## 2017-03-27 DIAGNOSIS — Z3480 Encounter for supervision of other normal pregnancy, unspecified trimester: Secondary | ICD-10-CM | POA: Diagnosis not present

## 2017-03-27 DIAGNOSIS — Z3A34 34 weeks gestation of pregnancy: Secondary | ICD-10-CM | POA: Diagnosis not present

## 2017-04-11 LAB — OB RESULTS CONSOLE GBS: GBS: NEGATIVE

## 2017-04-13 ENCOUNTER — Inpatient Hospital Stay (HOSPITAL_COMMUNITY)
Admission: AD | Admit: 2017-04-13 | Discharge: 2017-04-14 | Disposition: A | Discharge status: Home / Self Care | Payer: BLUE CROSS/BLUE SHIELD | Source: Ambulatory Visit | Attending: Obstetrics and Gynecology | Admitting: Obstetrics and Gynecology

## 2017-04-13 DIAGNOSIS — O368130 Decreased fetal movements, third trimester, not applicable or unspecified: Secondary | ICD-10-CM | POA: Diagnosis not present

## 2017-04-13 DIAGNOSIS — Z3A37 37 weeks gestation of pregnancy: Secondary | ICD-10-CM | POA: Diagnosis not present

## 2017-04-13 DIAGNOSIS — O479 False labor, unspecified: Secondary | ICD-10-CM

## 2017-04-13 NOTE — MAU Note (Signed)
PT  SAYS  [facility]  STRONG SINCE 5PM.    VE IN OFFICE   2 CM.   DENIES HSV AND  MRSA.   GBS- NEG

## 2017-04-14 ENCOUNTER — Encounter (HOSPITAL_COMMUNITY): Payer: Self-pay | Admitting: *Deleted

## 2017-04-14 DIAGNOSIS — Z3493 Encounter for supervision of normal pregnancy, unspecified, third trimester: Secondary | ICD-10-CM | POA: Diagnosis not present

## 2017-04-14 DIAGNOSIS — Z6791 Unspecified blood type, Rh negative: Secondary | ICD-10-CM | POA: Diagnosis not present

## 2017-04-14 DIAGNOSIS — O26893 Other specified pregnancy related conditions, third trimester: Secondary | ICD-10-CM | POA: Diagnosis not present

## 2017-04-14 DIAGNOSIS — O358XX0 Maternal care for other (suspected) fetal abnormality and damage, not applicable or unspecified: Secondary | ICD-10-CM | POA: Diagnosis not present

## 2017-04-14 DIAGNOSIS — Z3A37 37 weeks gestation of pregnancy: Secondary | ICD-10-CM | POA: Diagnosis not present

## 2017-04-14 DIAGNOSIS — O324XX0 Maternal care for high head at term, not applicable or unspecified: Secondary | ICD-10-CM | POA: Diagnosis not present

## 2017-04-14 NOTE — Discharge Instructions (Signed)
Fetal Movement Counts Patient Name: ________________________________________________ Patient Due Date: ____________________ What is a fetal movement count? A fetal movement count is the number of times that you feel your baby move during a certain amount of time. This may also be called a fetal kick count. A fetal movement count is recommended for every pregnant woman. You may be asked to start counting fetal movements as early as week 28 of your pregnancy. Pay attention to when your baby is most active. You may notice your baby's sleep and wake cycles. You may also notice things that make your baby move more. You should do a fetal movement count:  When your baby is normally most active.  At the same time each day. A good time to count movements is while you are resting, after having something to eat and drink. How do I count fetal movements? 1. Find a quiet, comfortable area. Sit, or lie down on your side. 2. Write down the date, the start time and stop time, and the number of movements that you felt between those two times. Take this information with you to your health care visits. 3. For 2 hours, count kicks, flutters, swishes, rolls, and jabs. You should feel at least 10 movements during 2 hours. 4. You may stop counting after you have felt 10 movements. 5. If you do not feel 10 movements in 2 hours, have something to eat and drink. Then, keep resting and counting for 1 hour. If you feel at least 4 movements during that hour, you may stop counting. Contact a health care provider if:  You feel fewer than 4 movements in 2 hours.  Your baby is not moving like he or she usually does. Date: ____________ Start time: ____________ Stop time: ____________ Movements: ____________ Date: ____________ Start time: ____________ Stop time: ____________ Movements: ____________ Date: ____________ Start time: ____________ Stop time: ____________ Movements: ____________ Date: ____________ Start time:  ____________ Stop time: ____________ Movements: ____________ Date: ____________ Start time: ____________ Stop time: ____________ Movements: ____________ Date: ____________ Start time: ____________ Stop time: ____________ Movements: ____________ Date: ____________ Start time: ____________ Stop time: ____________ Movements: ____________ Date: ____________ Start time: ____________ Stop time: ____________ Movements: ____________ Date: ____________ Start time: ____________ Stop time: ____________ Movements: ____________ This information is not intended to replace advice given to you by your health care provider. Make sure you discuss any questions you have with your health care provider. Document Released: 12/07/2006 Document Revised: 07/06/2016 Document Reviewed: 12/17/2015 Elsevier Interactive Patient Education  2017 [employer] Inc[employer] Braxton Hicks Contractions Contractions of the uterus can occur throughout pregnancy, but they are not always a sign that you are in labor. You may have practice contractions called Braxton Hicks contractions. These false labor contractions are sometimes confused with true labor. What are Braxton Hicks contractions? Braxton Hicks contractions are tightening movements that occur in the muscles of the uterus before labor. Unlike true labor contractions, these contractions do not result in opening (dilation) and thinning of the cervix. Toward the end of pregnancy (32-34 weeks), Braxton Hicks contractions can happen more often and may become stronger. These contractions are sometimes difficult to tell apart from true labor because they can be very uncomfortable. You should not feel embarrassed if you go to the hospital with false labor. Sometimes, the only way to tell if you are in true labor is for your health care provider to look for changes in the cervix. The health care provider will do a physical exam and may monitor your contractions. If you   are not in true labor, the exam  should show that your cervix is not dilating and your water has not broken. If there are no prenatal problems or other health problems associated with your pregnancy, it is completely safe for you to be sent home with false labor. You may continue to have Braxton Hicks contractions until you go into true labor. How can I tell the difference between true labor and false labor?  Differences  False labor  Contractions last 30-70 seconds.: Contractions are usually shorter and not as strong as true labor contractions.  Contractions become very regular.: Contractions are usually irregular.  Discomfort is usually felt in the top of the uterus, and it spreads to the lower abdomen and low back.: Contractions are often felt in the front of the lower abdomen and in the groin.  Contractions do not go away with walking.: Contractions may go away when you walk around or change positions while lying down.  Contractions usually become more intense and increase in frequency.: Contractions get weaker and are shorter-lasting as time goes on.  The cervix dilates and gets thinner.: The cervix usually does not dilate or become thin. Follow these instructions at home:  Take over-the-counter and prescription medicines only as told by your health care provider.  Keep up with your usual exercises and follow other instructions from your health care provider.  Eat and drink lightly if you think you are going into labor.  If Braxton Hicks contractions are making you uncomfortable:  Change your position from lying down or resting to walking, or change from walking to resting.  Sit and rest in a tub of warm water.  Drink enough fluid to keep your urine clear or pale yellow. Dehydration may cause these contractions.  Do slow and deep breathing several times an hour.  Keep all follow-up prenatal visits as told by your health care provider. This is important. Contact a health care provider if:  You have a  fever.  You have continuous pain in your abdomen. Get help right away if:  Your contractions become stronger, more regular, and closer together.  You have fluid leaking or gushing from your vagina.  You pass blood-tinged mucus (bloody show).  You have bleeding from your vagina.  You have low back pain that you never had before.  You feel your baby's head pushing down and causing pelvic pressure.  Your baby is not moving inside you as much as it used to. Summary  Contractions that occur before labor are called Braxton Hicks contractions, false labor, or practice contractions.  Braxton Hicks contractions are usually shorter, weaker, farther apart, and less regular than true labor contractions. True labor contractions usually become progressively stronger and regular and they become more frequent.  Manage discomfort from Braxton Hicks contractions by changing position, resting in a warm bath, drinking plenty of water, or practicing deep breathing. This information is not intended to replace advice given to you by your health care provider. Make sure you discuss any questions you have with your health care provider. Document Released: 11/07/2005 Document Revised: 09/26/2016 Document Reviewed: 09/26/2016 Elsevier Interactive Patient Education  2017 Elsevier Inc.  

## 2017-04-14 NOTE — MAU Note (Signed)
I have communicated with Dr. Rana SnareLowe and reviewed vital signs:  Vitals:   04/14/17 0004 04/14/17 0120  BP: 127/80 116/72  Pulse: (!) 110 99  Resp: 20     Vaginal exam:  Dilation: 2 Effacement (%): 90 Station: -2 Presentation: Vertex Exam by:: Kayren EavesAshley Aeryn Medici RN ,   Also reviewed contraction pattern and that non-stress test is reactive.  It has been documented that patient is contracting every 5-7  minutes with no cervical change over 1 hour not indicating active labor.  Patient denies any other complaints.  Based on this report provider has given order for discharge.  A discharge order and diagnosis entered by a provider.   Labor discharge instructions reviewed with patient.

## 2017-04-17 ENCOUNTER — Encounter (HOSPITAL_COMMUNITY): Payer: Self-pay | Admitting: *Deleted

## 2017-04-17 ENCOUNTER — Inpatient Hospital Stay (HOSPITAL_COMMUNITY)
Admission: AD | Admit: 2017-04-17 | Discharge: 2017-04-20 | DRG: 766 | Disposition: A | Discharge status: Home / Self Care | Payer: BLUE CROSS/BLUE SHIELD | Source: Ambulatory Visit | Attending: Obstetrics and Gynecology | Admitting: Obstetrics and Gynecology

## 2017-04-17 ENCOUNTER — Inpatient Hospital Stay (HOSPITAL_COMMUNITY): Payer: BLUE CROSS/BLUE SHIELD | Admitting: Anesthesiology

## 2017-04-17 ENCOUNTER — Encounter (HOSPITAL_COMMUNITY)
Admission: AD | Disposition: A | Discharge status: Home / Self Care | Payer: Self-pay | Source: Ambulatory Visit | Attending: Obstetrics and Gynecology

## 2017-04-17 DIAGNOSIS — O358XX0 Maternal care for other (suspected) fetal abnormality and damage, not applicable or unspecified: Principal | ICD-10-CM | POA: Diagnosis present

## 2017-04-17 DIAGNOSIS — Z3A37 37 weeks gestation of pregnancy: Secondary | ICD-10-CM | POA: Diagnosis not present

## 2017-04-17 DIAGNOSIS — Z6791 Unspecified blood type, Rh negative: Secondary | ICD-10-CM | POA: Diagnosis not present

## 2017-04-17 DIAGNOSIS — O324XX0 Maternal care for high head at term, not applicable or unspecified: Secondary | ICD-10-CM | POA: Diagnosis present

## 2017-04-17 DIAGNOSIS — O26893 Other specified pregnancy related conditions, third trimester: Secondary | ICD-10-CM | POA: Diagnosis present

## 2017-04-17 DIAGNOSIS — Z3493 Encounter for supervision of normal pregnancy, unspecified, third trimester: Secondary | ICD-10-CM | POA: Diagnosis present

## 2017-04-17 HISTORY — DX: Genetic susceptibility to other disease: Z15.89

## 2017-04-17 HISTORY — DX: Methylenetetrahydrofolate reductase deficiency: E72.12

## 2017-04-17 LAB — CBC
HEMATOCRIT: 34.4 % — AB (ref 36.0–46.0)
Hemoglobin: 11 g/dL — ABNORMAL LOW (ref 12.0–15.0)
MCH: 25.7 pg — AB (ref 26.0–34.0)
MCHC: 32 g/dL (ref 30.0–36.0)
MCV: 80.4 fL (ref 78.0–100.0)
PLATELETS: 326 10*3/uL (ref 150–400)
RBC: 4.28 MIL/uL (ref 3.87–5.11)
RDW: 15.3 % (ref 11.5–15.5)
WBC: 13 10*3/uL — ABNORMAL HIGH (ref 4.0–10.5)

## 2017-04-17 LAB — POCT FERN TEST: POCT Fern Test: NEGATIVE

## 2017-04-17 LAB — TYPE AND SCREEN
ABO/RH(D): O NEG
Antibody Screen: NEGATIVE

## 2017-04-17 SURGERY — Surgical Case
Anesthesia: Regional

## 2017-04-17 MED ORDER — PROMETHAZINE HCL 25 MG/ML IJ SOLN: 6.2500 mg | INTRAMUSCULAR | Status: DC | PRN

## 2017-04-17 MED ORDER — DEXAMETHASONE SODIUM PHOSPHATE 10 MG/ML IJ SOLN
INTRAMUSCULAR | Status: DC | PRN
  Administered 2017-04-17: 4 mg via INTRAVENOUS

## 2017-04-17 MED ORDER — LIDOCAINE-EPINEPHRINE 2 %-1:100000 IJ SOLN
INTRAMUSCULAR | Status: DC | PRN
  Administered 2017-04-17 (×4): 5 mL via INTRADERMAL

## 2017-04-17 MED ORDER — OXYTOCIN 10 UNIT/ML IJ SOLN
INTRAMUSCULAR | Status: DC | PRN
  Administered 2017-04-17: 40 [IU] via INTRAVENOUS

## 2017-04-17 MED ORDER — HYDROMORPHONE HCL 1 MG/ML IJ SOLN
0.2500 mg | INTRAMUSCULAR | Status: DC | PRN
  Administered 2017-04-17: 0.5 mg via INTRAVENOUS

## 2017-04-17 MED ORDER — SODIUM CHLORIDE 0.9 % IR SOLN
Status: DC | PRN
  Administered 2017-04-17: 1

## 2017-04-17 MED ORDER — DEXAMETHASONE SODIUM PHOSPHATE 4 MG/ML IJ SOLN
INTRAMUSCULAR | Status: AC
  Filled 2017-04-17: qty 1

## 2017-04-17 MED ORDER — SCOPOLAMINE 1 MG/3DAYS TD PT72
MEDICATED_PATCH | TRANSDERMAL | Status: AC
  Filled 2017-04-17: qty 1

## 2017-04-17 MED ORDER — OXYTOCIN 10 UNIT/ML IJ SOLN
INTRAMUSCULAR | Status: AC
  Filled 2017-04-17: qty 4

## 2017-04-17 MED ORDER — BUPIVACAINE HCL (PF) 0.25 % IJ SOLN
INTRAMUSCULAR | Status: DC | PRN
  Administered 2017-04-17: 5 mL via EPIDURAL
  Administered 2017-04-17: 3 mL via EPIDURAL

## 2017-04-17 MED ORDER — LIDOCAINE-EPINEPHRINE (PF) 2 %-1:200000 IJ SOLN: INTRAMUSCULAR | Status: DC | PRN

## 2017-04-17 MED ORDER — KETOROLAC TROMETHAMINE 30 MG/ML IJ SOLN
30.0000 mg | Freq: Four times a day (QID) | INTRAMUSCULAR | Status: DC | PRN
  Administered 2017-04-17: 30 mg via INTRAMUSCULAR

## 2017-04-17 MED ORDER — PHENYLEPHRINE HCL 10 MG/ML IJ SOLN
INTRAMUSCULAR | Status: DC | PRN
  Administered 2017-04-17 (×4): 80 ug via INTRAVENOUS

## 2017-04-17 MED ORDER — CHLOROPROCAINE HCL (PF) 3 % IJ SOLN
INTRAMUSCULAR | Status: DC | PRN
  Administered 2017-04-17: 2 mL via EPIDURAL
  Administered 2017-04-17: 3 mL via EPIDURAL
  Administered 2017-04-17: 5 mL via EPIDURAL

## 2017-04-17 MED ORDER — OXYCODONE-ACETAMINOPHEN 5-325 MG PO TABS: 2.0000 | ORAL_TABLET | ORAL | Status: DC | PRN

## 2017-04-17 MED ORDER — OXYTOCIN BOLUS FROM INFUSION: 500.0000 mL | Freq: Once | INTRAVENOUS | Status: DC

## 2017-04-17 MED ORDER — TERBUTALINE SULFATE 1 MG/ML IJ SOLN
0.2500 mg | Freq: Once | INTRAMUSCULAR | Status: DC | PRN
Start: 2017-04-17 — End: 2017-04-17

## 2017-04-17 MED ORDER — ACETAMINOPHEN 325 MG PO TABS: 650.0000 mg | ORAL_TABLET | ORAL | Status: DC | PRN

## 2017-04-17 MED ORDER — ONDANSETRON HCL 4 MG/2ML IJ SOLN
INTRAMUSCULAR | Status: DC | PRN
  Administered 2017-04-17: 4 mg via INTRAVENOUS

## 2017-04-17 MED ORDER — METOCLOPRAMIDE HCL 5 MG/ML IJ SOLN
INTRAMUSCULAR | Status: DC | PRN
  Administered 2017-04-17: 10 mg via INTRAVENOUS

## 2017-04-17 MED ORDER — PROPOFOL 10 MG/ML IV BOLUS
INTRAVENOUS | Status: AC
  Filled 2017-04-17: qty 20

## 2017-04-17 MED ORDER — OXYCODONE-ACETAMINOPHEN 5-325 MG PO TABS: 1.0000 | ORAL_TABLET | ORAL | Status: DC | PRN

## 2017-04-17 MED ORDER — MORPHINE SULFATE (PF) 0.5 MG/ML IJ SOLN
INTRAMUSCULAR | Status: DC | PRN
  Administered 2017-04-17: 4 mg via EPIDURAL
  Administered 2017-04-17: 1 mg via INTRAVENOUS

## 2017-04-17 MED ORDER — LACTATED RINGERS IV SOLN
500.0000 mL | Freq: Once | INTRAVENOUS | Status: AC
  Administered 2017-04-17: 500 mL via INTRAVENOUS

## 2017-04-17 MED ORDER — MEPERIDINE HCL 25 MG/ML IJ SOLN: 6.2500 mg | INTRAMUSCULAR | Status: DC | PRN

## 2017-04-17 MED ORDER — PHENYLEPHRINE 40 MCG/ML (10ML) SYRINGE FOR IV PUSH (FOR BLOOD PRESSURE SUPPORT)
80.0000 ug | PREFILLED_SYRINGE | INTRAVENOUS | Status: DC | PRN
Start: 2017-04-17 — End: 2017-04-17

## 2017-04-17 MED ORDER — MEPERIDINE HCL 25 MG/ML IJ SOLN
INTRAMUSCULAR | Status: AC
  Filled 2017-04-17: qty 1

## 2017-04-17 MED ORDER — SCOPOLAMINE 1 MG/3DAYS TD PT72
MEDICATED_PATCH | TRANSDERMAL | Status: DC | PRN
  Administered 2017-04-17: 1 via TRANSDERMAL

## 2017-04-17 MED ORDER — MEPERIDINE HCL 25 MG/ML IJ SOLN
INTRAMUSCULAR | Status: DC | PRN
  Administered 2017-04-17 (×2): 12.5 mg via INTRAVENOUS

## 2017-04-17 MED ORDER — DIPHENHYDRAMINE HCL 50 MG/ML IJ SOLN
12.5000 mg | INTRAMUSCULAR | Status: DC | PRN
  Administered 2017-04-17: 25 mg via INTRAVENOUS

## 2017-04-17 MED ORDER — OXYTOCIN 40 UNITS IN LACTATED RINGERS INFUSION - SIMPLE MED
2.5000 [IU]/h | INTRAVENOUS | Status: DC
  Filled 2017-04-17: qty 1000

## 2017-04-17 MED ORDER — MORPHINE SULFATE (PF) 0.5 MG/ML IJ SOLN
INTRAMUSCULAR | Status: AC
  Filled 2017-04-17: qty 10

## 2017-04-17 MED ORDER — LIDOCAINE HCL (PF) 1 % IJ SOLN: 30.0000 mL | INTRAMUSCULAR | Status: DC | PRN

## 2017-04-17 MED ORDER — SOD CITRATE-CITRIC ACID 500-334 MG/5ML PO SOLN
30.0000 mL | ORAL | Status: DC | PRN
  Administered 2017-04-17: 30 mL via ORAL
  Filled 2017-04-17: qty 15

## 2017-04-17 MED ORDER — FLEET ENEMA 7-19 GM/118ML RE ENEM: 1.0000 | ENEMA | RECTAL | Status: DC | PRN

## 2017-04-17 MED ORDER — ONDANSETRON HCL 4 MG/2ML IJ SOLN
4.0000 mg | Freq: Four times a day (QID) | INTRAMUSCULAR | Status: DC | PRN
  Administered 2017-04-17: 4 mg via INTRAVENOUS
  Filled 2017-04-17: qty 2

## 2017-04-17 MED ORDER — FENTANYL CITRATE (PF) 100 MCG/2ML IJ SOLN
INTRAMUSCULAR | Status: AC
  Filled 2017-04-17: qty 2

## 2017-04-17 MED ORDER — LIDOCAINE HCL (PF) 1 % IJ SOLN
INTRAMUSCULAR | Status: DC | PRN
  Administered 2017-04-17: 6 mL via EPIDURAL
  Administered 2017-04-17: 7 mL via EPIDURAL

## 2017-04-17 MED ORDER — MEPERIDINE HCL 25 MG/ML IJ SOLN
6.2500 mg | INTRAMUSCULAR | Status: DC | PRN
  Administered 2017-04-17: 6.25 mg via INTRAVENOUS

## 2017-04-17 MED ORDER — KETOROLAC TROMETHAMINE 30 MG/ML IJ SOLN
INTRAMUSCULAR | Status: AC
  Filled 2017-04-17: qty 1

## 2017-04-17 MED ORDER — CEFAZOLIN SODIUM-DEXTROSE 1-4 GM/50ML-% IV SOLN
INTRAVENOUS | Status: DC | PRN
  Administered 2017-04-17: 2 g via INTRAVENOUS

## 2017-04-17 MED ORDER — HYDROMORPHONE HCL 1 MG/ML IJ SOLN
INTRAMUSCULAR | Status: AC
  Filled 2017-04-17: qty 1

## 2017-04-17 MED ORDER — FENTANYL 2.5 MCG/ML BUPIVACAINE 1/10 % EPIDURAL INFUSION (WH - ANES)
14.0000 mL/h | INTRAMUSCULAR | Status: DC | PRN
  Administered 2017-04-17 (×2): 14 mL/h via EPIDURAL
  Filled 2017-04-17 (×2): qty 100

## 2017-04-17 MED ORDER — KETOROLAC TROMETHAMINE 30 MG/ML IJ SOLN: 30.0000 mg | Freq: Four times a day (QID) | INTRAMUSCULAR | Status: DC | PRN

## 2017-04-17 MED ORDER — LACTATED RINGERS IV SOLN
INTRAVENOUS | Status: DC | PRN
  Administered 2017-04-17 (×2): via INTRAVENOUS

## 2017-04-17 MED ORDER — KETAMINE HCL 10 MG/ML IJ SOLN
INTRAMUSCULAR | Status: DC | PRN
  Administered 2017-04-17 (×2): 20 mg via INTRAVENOUS

## 2017-04-17 MED ORDER — KETOROLAC TROMETHAMINE 30 MG/ML IJ SOLN: 30.0000 mg | Freq: Once | INTRAMUSCULAR | Status: DC | PRN

## 2017-04-17 MED ORDER — LACTATED RINGERS IV SOLN
INTRAVENOUS | Status: DC
  Administered 2017-04-17 (×2): via INTRAVENOUS

## 2017-04-17 MED ORDER — EPHEDRINE 5 MG/ML INJ: 10.0000 mg | INTRAVENOUS | Status: DC | PRN

## 2017-04-17 MED ORDER — PHENYLEPHRINE 40 MCG/ML (10ML) SYRINGE FOR IV PUSH (FOR BLOOD PRESSURE SUPPORT)
80.0000 ug | PREFILLED_SYRINGE | INTRAVENOUS | Status: DC | PRN
  Filled 2017-04-17 (×2): qty 10

## 2017-04-17 MED ORDER — FENTANYL CITRATE (PF) 100 MCG/2ML IJ SOLN
INTRAMUSCULAR | Status: DC | PRN
  Administered 2017-04-17: 100 ug via INTRAVENOUS
  Administered 2017-04-17: 100 ug via EPIDURAL

## 2017-04-17 MED ORDER — PHENYLEPHRINE 40 MCG/ML (10ML) SYRINGE FOR IV PUSH (FOR BLOOD PRESSURE SUPPORT)
PREFILLED_SYRINGE | INTRAVENOUS | Status: AC
  Filled 2017-04-17: qty 20

## 2017-04-17 MED ORDER — ONDANSETRON HCL 4 MG/2ML IJ SOLN
INTRAMUSCULAR | Status: AC
  Filled 2017-04-17: qty 2

## 2017-04-17 MED ORDER — LACTATED RINGERS IV SOLN: 500.0000 mL | INTRAVENOUS | Status: DC | PRN

## 2017-04-17 MED ORDER — KETAMINE HCL 10 MG/ML IJ SOLN
INTRAMUSCULAR | Status: AC
  Filled 2017-04-17: qty 1

## 2017-04-17 MED ORDER — METOCLOPRAMIDE HCL 5 MG/ML IJ SOLN
INTRAMUSCULAR | Status: AC
  Filled 2017-04-17: qty 2

## 2017-04-17 MED ORDER — OXYTOCIN 40 UNITS IN LACTATED RINGERS INFUSION - SIMPLE MED
1.0000 m[IU]/min | INTRAVENOUS | Status: DC
  Administered 2017-04-17: 2 m[IU]/min via INTRAVENOUS

## 2017-04-17 SURGICAL SUPPLY — 35 items
BENZOIN TINCTURE PRP APPL 2/3 (GAUZE/BANDAGES/DRESSINGS) ×2 IMPLANT
CHLORAPREP W/TINT 26ML (MISCELLANEOUS) ×2 IMPLANT
CLAMP CORD UMBIL (MISCELLANEOUS) IMPLANT
CLOTH BEACON ORANGE TIMEOUT ST (SAFETY) ×2 IMPLANT
CLSR STERI-STRIP ANTIMIC 1/2X4 (GAUZE/BANDAGES/DRESSINGS) ×2 IMPLANT
DERMABOND ADVANCED (GAUZE/BANDAGES/DRESSINGS) ×1
DERMABOND ADVANCED .7 DNX12 (GAUZE/BANDAGES/DRESSINGS) ×1 IMPLANT
DRSG OPSITE POSTOP 4X10 (GAUZE/BANDAGES/DRESSINGS) ×2 IMPLANT
ELECT REM PT RETURN 9FT ADLT (ELECTROSURGICAL) ×2
ELECTRODE REM PT RTRN 9FT ADLT (ELECTROSURGICAL) ×1 IMPLANT
EXTRACTOR VACUUM KIWI (MISCELLANEOUS) IMPLANT
GLOVE BIO SURGEON STRL SZ 6.5 (GLOVE) ×2 IMPLANT
GLOVE BIOGEL PI IND STRL 6.5 (GLOVE) ×1 IMPLANT
GLOVE BIOGEL PI IND STRL 7.0 (GLOVE) ×2 IMPLANT
GLOVE BIOGEL PI INDICATOR 6.5 (GLOVE) ×1
GLOVE BIOGEL PI INDICATOR 7.0 (GLOVE) ×2
GOWN STRL REUS W/TWL LRG LVL3 (GOWN DISPOSABLE) ×4 IMPLANT
KIT ABG SYR 3ML LUER SLIP (SYRINGE) ×2 IMPLANT
NEEDLE HYPO 25X5/8 SAFETYGLIDE (NEEDLE) ×2 IMPLANT
NS IRRIG 1000ML POUR BTL (IV SOLUTION) ×2 IMPLANT
PACK C SECTION WH (CUSTOM PROCEDURE TRAY) ×2 IMPLANT
PAD OB MATERNITY 4.3X12.25 (PERSONAL CARE ITEMS) ×2 IMPLANT
PENCIL SMOKE EVAC W/HOLSTER (ELECTROSURGICAL) ×2 IMPLANT
RETRACTOR WND ALEXIS 25 LRG (MISCELLANEOUS) IMPLANT
RTRCTR WOUND ALEXIS 25CM LRG (MISCELLANEOUS)
SUT PLAIN 0 NONE (SUTURE) IMPLANT
SUT PLAIN 2 0 (SUTURE) ×1
SUT PLAIN ABS 2-0 CT1 27XMFL (SUTURE) ×1 IMPLANT
SUT VIC AB 0 CT1 36 (SUTURE) ×2 IMPLANT
SUT VIC AB 0 CTX 36 (SUTURE) ×2
SUT VIC AB 0 CTX36XBRD ANBCTRL (SUTURE) ×2 IMPLANT
SUT VIC AB 4-0 KS 27 (SUTURE) IMPLANT
SUT VIC AB 4-0 PS2 27 (SUTURE) ×2 IMPLANT
TOWEL OR 17X24 6PK STRL BLUE (TOWEL DISPOSABLE) ×2 IMPLANT
TRAY FOLEY BAG SILVER LF 14FR (SET/KITS/TRAYS/PACK) IMPLANT

## 2017-04-17 NOTE — Op Note (Signed)
PROCEDURE DATE: 04/17/17  PREOPERATIVE DIAGNOSIS: Intrauterine pregnancy at 37.6 wga, Indication: arrest of descent  POSTOPERATIVE DIAGNOSIS:The same  PROCEDURE: primaryLow TransverseCesarean Section  SURGEON: Dr. Belva AgeeElise Leger  INDICATIONS:This is a 25yo G1P0 at 2637.6 wga requiring cesarean section secondary to arrest of descent. She presented in labor. She ultimately progressed to c/c/0 with fetal head transverse. Manual attempt at rotation and position changes failed to rotate the head. She pushed x 2 hours with good effort but minimal progress. Decision made to proceed with pLTCS.The risks of cesarean section discussed with the patient included but were not limited to: bleeding which may require transfusion or reoperation; infection which may require antibiotics; injury to bowel, bladder, ureters or other surrounding organs; injury to the fetus; need for additional procedures including hysterectomy in the event of a life-threatening hemorrhage; placental abnormalities wth subsequent pregnancies, incisional problems, thromboembolic phenomenon and other postoperative/anesthesia complications. The patient agreed with the proposed plan, giving informed consent for the procedure.   FINDINGS: Viable maleinfant in vertex presentation,APGARs pending, Weight pending, Amniotic fluid clear, Intact placenta, three vessel cord. Grossly normal uterus, ovaries and fallopian tubes. .  ANESTHESIA: Epidural ESTIMATED BLOOD LOSS: 800cc SPECIMENS: Placenta for pathology COMPLICATIONS: None immediate   PROCEDURE IN DETAIL: The patient received intravenous antibiotics (2g Ancef) and had sequential compression devices applied to her lower extremities while in the preoperative area. Shewasthen taken to the operating roomwhere epidural anesthesiawas dosed up to surgical level andwas found to be adequate. She was then placed in a dorsal supine position with a leftward tilt,and prepped and  draped in a sterile manner.A foley catheter was placed into her bladder and attached to constant gravity. After an adequate timeout was performed, aPfannenstiel skin incision was made with scalpel and carried through to the underlying layer of fascia. The fascia was incised in the midline and this incision was extended bilaterally using the Mayo scissors. Kocher clamps were applied to the superior aspect of the fascial incision and the underlying rectus muscles were dissected off bluntly. A similar process was carried out on the inferior aspect of the facial incision. The rectus muscles were separated in the midline bluntly and the peritoneum was entered bluntly. A bladder flap was created sharply and developed bluntly.Atransverse hysterotomy was made with a scalpel and extended bilaterally bluntly. The bladder blade was then removed. The infant was successfully delivered, and cord was clamped and cut and infant was handed over to awaiting neonatology team. Uterine massage was then administered and the placenta delivered via manual extraction with three-vessel cord. Cord gases were taken. The uterus was cleared of clot and debris. The hysterotomy was closed with 0 vicryl.A second imbricating suture of 0-vicryl was used to reinforce the incision and aid in hemostasis.The fascia was closed with 0-Vicryl in a running fashion with good restoration of anatomy. The subcutaneus tissue was irrigated and was reapproximated using three interrupted plain gut stitches. The skin was closed with 4-0 Vicryl in a subcuticular fashion.  Final EBL was 800cc (all surgical site and was hemostatic at end of procedure) without any further bleeding on exam.   Pt tolerated the procedure well. All sponge/lap/needle counts were correct X 2. Pt taken to recovery room in stable condition.  It's a boy - "Rosalyn GessGrayson"!   Belva AgeeElise Leger MD

## 2017-04-17 NOTE — Progress Notes (Signed)
Patient has been pushing for >2 hours with minimal progress. + Caput. Fetal vertex remains at zero station despite excellent patient effort, multiple position changes, and attempt at manual rotation of head to OA. Vertex is persistently OT. Discussed my recommendation for cesarean section given minimal progress. Gave option of continuing to push 1-2 hours longer - however cautioned that I recommended against that given such minimal change. Patient opts for C section. Risks discussed including infection, bleeding, damage to the surrounding structures and the need for additional procedures. H/o multiple abdominal surgeries - at increased risk of scar tissue/damage to surrounding tissue/bleeding, etc. Patient understands and consent signed. 2g ancef on call to OR - pt w/slight rash when receives PCN thus ancef should be tolerated okay. D/w patient. To OR now.   Rosie FateE Leger MD

## 2017-04-17 NOTE — Transfer of Care (Signed)
Immediate Anesthesia Transfer of Care Note  Patient: Pamela Larsen  Procedure(s) Performed: Procedure(s): CESAREAN SECTION (N/A)  Patient Location: PACU  Anesthesia Type:Epidural  Level of Consciousness: awake, alert  and oriented  Airway & Oxygen Therapy: Patient Spontanous Breathing  Post-op Assessment: Report given to RN and Post -op Vital signs reviewed and stable  Post vital signs: Reviewed and stable  Last Vitals:  Vitals:   04/17/17 1843 04/17/17 1858  BP:    Pulse:    Resp: 20 18  Temp:      Last Pain:  Vitals:   04/17/17 1858  TempSrc:   PainSc: 2          Complications: No apparent anesthesia complications

## 2017-04-17 NOTE — Consult Note (Signed)
Neonatology Note:   Attendance at C-section:    I was asked by Dr. Leger to attend this primary C/S at term due to arrest of descent. The mother is a G1P0 O neg, GBS neg with endometriosis, IBS, Lyme disease, and hypothyroidism. ROM 6.5 hours prior to delivery, fluid clear. Infant vigorous with good spontaneous cry and tone. Delayed cord clamping was done. Needed only minimal bulb suctioning. Ap 9/9. Lungs clear to ausc in DR. To CN to care of Pediatrician.   Pamela Snyders C. [name], MD 

## 2017-04-17 NOTE — Anesthesia Procedure Notes (Signed)
Epidural Patient location during procedure: OB Start time: 04/17/2017 1:06 PM End time: 04/17/2017 1:10 PM  Staffing Anesthesiologist: Leilani AbleHATCHETT, Cecil Bixby Performed: anesthesiologist   Preanesthetic Checklist Completed: patient identified, surgical consent, pre-op evaluation, timeout performed, IV checked, risks and benefits discussed and monitors and equipment checked  Epidural Patient position: sitting Prep: site prepped and draped and DuraPrep Patient monitoring: continuous pulse ox and blood pressure Approach: midline Location: L3-L4 Injection technique: LOR air  Needle:  Needle type: Tuohy  Needle gauge: 17 G Needle length: 9 cm and 9 Needle insertion depth: 5 cm cm Catheter type: closed end flexible Catheter size: 19 Gauge Catheter at skin depth: 10 cm Test dose: negative and Other  Assessment Sensory level: T9 Events: blood not aspirated, injection not painful, no injection resistance, negative IV test and no paresthesia  Additional Notes Reason for block:procedure for pain

## 2017-04-17 NOTE — Brief Op Note (Addendum)
04/17/2017  10:10 PM  PATIENT:  Pamela Larsen  26 y.o. female  PRE-OPERATIVE DIAGNOSIS:  CESAREAN SECTION  POST-OPERATIVE DIAGNOSIS:  CESAREAN SECTION  PROCEDURE:  Procedure(s): CESAREAN SECTION (N/A)  SURGEON:  Surgeon(s) and Role:    * Ranae PilaLeger, Cristian Davitt Jennifer, MD - Primary  ANESTHESIA:   epidural  EBL:  Total I/O In: 2400 [I.V.:2400] Out: 1000 [Urine:200; Blood:800]  BLOOD ADMINISTERED:none  DRAINS: Urinary Catheter (Foley)   LOCAL MEDICATIONS USED:  NONE  SPECIMEN:  Source of Specimen:  placenta  DISPOSITION OF SPECIMEN:  PATHOLOGY  COUNTS:  YES  TOURNIQUET:  * No tourniquets in log *  DICTATION: .Note written in EPIC  PLAN OF CARE: Admit to inpatient   PATIENT DISPOSITION:  PACU - hemodynamically stable.   Delay start of Pharmacological VTE agent (>24hrs) due to surgical blood loss or risk of bleeding: not applicable

## 2017-04-17 NOTE — Anesthesia Pain Management Evaluation Note (Signed)
  [facility] Pain Management Visit Note  Patient: Pamela Larsen, 26 y.o., female  "Hello I am a [facility] of the anesthesia [facility] at [facility]. We have an anesthesia team available at all times to provide care throughout the hospital, including epidural management and anesthesia for C-section. I don't know your plan for the delivery whether it a natural birth, water birth, IV sedation, nitrous supplementation, doula or epidural, but we want to meet your pain goals."   1.Was your pain managed to your expectations on prior hospitalizations?   No prior hospitalizations  2.What is your expectation for pain management during this hospitalization?     Epidural  3.How can we help you reach that goal? Epidural recently placed.  Record the patient's initial score and the patient's pain goal.   Pain: 5  Pain Goal: 8 [facility] wants you to be able to say your pain was always managed very well.  [name] L 04/17/2017

## 2017-04-17 NOTE — MAU Note (Signed)
+  contractions since 6am this morning +leaking clear fluid since 0700 this am +FM +bloody show States was 2 cm 3 days ago

## 2017-04-17 NOTE — H&P (Signed)
Pamela Larsen is a 26 y.o. female presenting for labor, 5.5cm. OB History    Gravida Para Term Preterm AB Living   1         0   SAB TAB Ectopic Multiple Live Births                 Past Medical History:  Diagnosis Date  . Acne   . Endometriosis   . Hypothyroidism   . IBS (irritable bowel syndrome)   . Lyme disease    Past Surgical History:  Procedure Laterality Date  . APPENDECTOMY    . CHOLECYSTECTOMY    . presacral neurectomy    . TONSILLECTOMY     Family History: family history includes Rheum arthritis in her mother. Social History:  reports that she has never smoked. She has never used smokeless tobacco. She reports that she does not drink alcohol or use drugs.     Maternal Diabetes: No Genetic Screening: Normal Maternal Ultrasounds/Referrals: Abnormal:  Findings:   Fetal renal pyelectasis Fetal Ultrasounds or other Referrals:  None Maternal Substance Abuse:  No Significant Maternal Medications:  None Significant Maternal Lab Results:  None Other Comments:  None  ROS History Dilation: 5.5 Effacement (%): 90 Station: -2 Exam by:: [name] RNC Weight 102.1 kg (225 lb 0.6 oz). Exam Physical Exam  (from clinic) NAD, A&O NWOB Abd soft, nondistended, gravid SVE as above per RN  Prenatal labs: ABO, Rh:   Antibody:   Rubella:   RPR:    HBsAg:    HIV:    GBS:     Assessment/Plan: 25yo G1P0 @37 .6wga presenting in labor. H/o multiple abdominal surgies s/s endometriosis. Also w/h/o hypothyroidism, asthma, and MTHFR mutation. Had a cardiology referral this pregnancy for isolated increased HR x 1. Patient is RH neg and FOB RH +, s/p rhogam. Will be for Rhogam eval pp. Plan for expectant management, pitocin PRN. Will AROM when has cle.  GBS neg.    Pamela Larsen Pamela Larsen 04/17/2017, 12:30 PM

## 2017-04-17 NOTE — Anesthesia Preprocedure Evaluation (Addendum)
Anesthesia Evaluation  Patient identified by MRN, date of birth, ID band Patient awake    Reviewed: Allergy & Precautions, H&P , NPO status , Patient's Chart, lab work & pertinent test results  Airway Mallampati: II  TM Distance: >3 FB Neck ROM: full    Dental no notable dental hx.    Pulmonary neg pulmonary ROS,    Pulmonary exam normal breath sounds clear to auscultation       Cardiovascular negative cardio ROS Normal cardiovascular exam Rhythm:regular Rate:Normal     Neuro/Psych negative neurological ROS  negative psych ROS   GI/Hepatic negative GI ROS, Neg liver ROS,   Endo/Other    Renal/GU negative Renal ROS  negative genitourinary   Musculoskeletal   Abdominal (+) + obese,   Peds  Hematology negative hematology ROS (+)   Anesthesia Other Findings   Reproductive/Obstetrics (+) Pregnancy                             Anesthesia Physical Anesthesia Plan  ASA: II  Anesthesia Plan: Epidural   Post-op Pain Management:    Induction:   Airway Management Planned:   Additional Equipment:   Intra-op Plan:   Post-operative Plan:   Informed Consent: I have reviewed the patients History and Physical, chart, labs and discussed the procedure including the risks, benefits and alternatives for the proposed anesthesia with the patient or authorized representative who has indicated his/her understanding and acceptance.     Plan Discussed with: CRNA and Surgeon  Anesthesia Plan Comments: (For C/S with labor epidural.)      Anesthesia Quick Evaluation

## 2017-04-17 NOTE — Progress Notes (Signed)
See medication flowsheet for epidural medication boluses given by CRNA. Currently to ice, patient has a T7 epidural level bilaterally. I informed patient and L&D RN that I am comfortable with this epidural level and believe that it will provide adequate pain relief when Dr. Elon SpannerLeger breaks the patient's water. However, if the patient starts to feel pain or more intense contractions, I said to please call me/anesthesia so that we can reassess the epidural and her epidural level. I said that at that point I would give her something stronger through her epidural, depending on the situation, and that our desire is to keep her comfortable.  Iantha FallenA. Massey Ruhland CRNA

## 2017-04-17 NOTE — Progress Notes (Signed)
S: Feels painful contractions that are improving with cle.   O:  Vitals  Vitals:   04/17/17 1406 04/17/17 1410  BP: 121/77 122/75  Pulse: 93 93  Resp: 16   Temp:      Gen: NAD SVE: 8/c/-1, OT FHT: cat 1 Toco: quiet  A/P: Pt is a G1P0 here with labor. 8cm after AROM. Toco unable to trace ctxn so IUPC placed after verbally consenting patient. Continue exp management, prn pitocin.  GBS neg.  Belva AgeeElise Aleza Pew MD

## 2017-04-17 NOTE — Progress Notes (Addendum)
C/c/0, OT. Attempt to rotate fetal vertex - slight ROA now. Will start to push.    Rosie FateE Emer Onnen MD

## 2017-04-18 ENCOUNTER — Encounter (HOSPITAL_COMMUNITY): Payer: Self-pay | Admitting: Obstetrics and Gynecology

## 2017-04-18 LAB — CBC
HCT: 28.3 % — ABNORMAL LOW (ref 36.0–46.0)
Hemoglobin: 9.3 g/dL — ABNORMAL LOW (ref 12.0–15.0)
MCH: 26.4 pg (ref 26.0–34.0)
MCHC: 32.9 g/dL (ref 30.0–36.0)
MCV: 80.4 fL (ref 78.0–100.0)
PLATELETS: 281 10*3/uL (ref 150–400)
RBC: 3.52 MIL/uL — ABNORMAL LOW (ref 3.87–5.11)
RDW: 15.6 % — ABNORMAL HIGH (ref 11.5–15.5)
WBC: 22.2 10*3/uL — ABNORMAL HIGH (ref 4.0–10.5)

## 2017-04-18 LAB — RPR: RPR: NONREACTIVE

## 2017-04-18 MED ORDER — SIMETHICONE 80 MG PO CHEW: 80.0000 mg | CHEWABLE_TABLET | ORAL | Status: DC | PRN

## 2017-04-18 MED ORDER — NALBUPHINE HCL 10 MG/ML IJ SOLN: 5.0000 mg | INTRAMUSCULAR | Status: DC | PRN

## 2017-04-18 MED ORDER — ZOLPIDEM TARTRATE 5 MG PO TABS: 5.0000 mg | ORAL_TABLET | Freq: Every evening | ORAL | Status: DC | PRN

## 2017-04-18 MED ORDER — RHO D IMMUNE GLOBULIN 1500 UNIT/2ML IJ SOSY
300.0000 ug | PREFILLED_SYRINGE | Freq: Once | INTRAMUSCULAR | Status: AC
  Administered 2017-04-18: 300 ug via INTRAVENOUS
  Filled 2017-04-18: qty 2

## 2017-04-18 MED ORDER — NALBUPHINE HCL 10 MG/ML IJ SOLN: 5.0000 mg | Freq: Once | INTRAMUSCULAR | Status: DC | PRN

## 2017-04-18 MED ORDER — MENTHOL 3 MG MT LOZG: 1.0000 | LOZENGE | OROMUCOSAL | Status: DC | PRN

## 2017-04-18 MED ORDER — SCOPOLAMINE 1 MG/3DAYS TD PT72
1.0000 | MEDICATED_PATCH | Freq: Once | TRANSDERMAL | Status: DC
  Filled 2017-04-18: qty 1

## 2017-04-18 MED ORDER — SODIUM CHLORIDE 0.9% FLUSH: 3.0000 mL | INTRAVENOUS | Status: DC | PRN

## 2017-04-18 MED ORDER — DIPHENHYDRAMINE HCL 25 MG PO CAPS: 25.0000 mg | ORAL_CAPSULE | Freq: Four times a day (QID) | ORAL | Status: DC | PRN

## 2017-04-18 MED ORDER — PRENATAL MULTIVITAMIN CH
1.0000 | ORAL_TABLET | Freq: Every day | ORAL | Status: DC
  Administered 2017-04-18: 1 via ORAL
  Filled 2017-04-18: qty 1

## 2017-04-18 MED ORDER — OXYCODONE HCL 5 MG PO TABS
5.0000 mg | ORAL_TABLET | ORAL | Status: DC | PRN
  Administered 2017-04-18 – 2017-04-19 (×4): 5 mg via ORAL
  Filled 2017-04-18 (×4): qty 1

## 2017-04-18 MED ORDER — IBUPROFEN 600 MG PO TABS
600.0000 mg | ORAL_TABLET | Freq: Four times a day (QID) | ORAL | Status: DC
  Administered 2017-04-18 – 2017-04-20 (×8): 600 mg via ORAL
  Filled 2017-04-18 (×9): qty 1

## 2017-04-18 MED ORDER — DIPHENHYDRAMINE HCL 25 MG PO CAPS: 25.0000 mg | ORAL_CAPSULE | ORAL | Status: DC | PRN

## 2017-04-18 MED ORDER — SIMETHICONE 80 MG PO CHEW
80.0000 mg | CHEWABLE_TABLET | Freq: Three times a day (TID) | ORAL | Status: DC
  Administered 2017-04-18 – 2017-04-20 (×6): 80 mg via ORAL
  Filled 2017-04-18 (×4): qty 1

## 2017-04-18 MED ORDER — NALOXONE HCL 2 MG/2ML IJ SOSY
1.0000 ug/kg/h | PREFILLED_SYRINGE | INTRAMUSCULAR | Status: DC | PRN
  Filled 2017-04-18: qty 2

## 2017-04-18 MED ORDER — TETANUS-DIPHTH-ACELL PERTUSSIS 5-2.5-18.5 LF-MCG/0.5 IM SUSP: 0.5000 mL | Freq: Once | INTRAMUSCULAR | Status: DC

## 2017-04-18 MED ORDER — NALOXONE HCL 0.4 MG/ML IJ SOLN: 0.4000 mg | INTRAMUSCULAR | Status: DC | PRN

## 2017-04-18 MED ORDER — DIBUCAINE 1 % RE OINT: 1.0000 "application " | TOPICAL_OINTMENT | RECTAL | Status: DC | PRN

## 2017-04-18 MED ORDER — OXYTOCIN 40 UNITS IN LACTATED RINGERS INFUSION - SIMPLE MED: 2.5000 [IU]/h | INTRAVENOUS | Status: AC

## 2017-04-18 MED ORDER — OXYCODONE HCL 5 MG PO TABS
10.0000 mg | ORAL_TABLET | ORAL | Status: DC | PRN
  Administered 2017-04-19 – 2017-04-20 (×5): 10 mg via ORAL
  Filled 2017-04-18 (×5): qty 2

## 2017-04-18 MED ORDER — IBUPROFEN 600 MG PO TABS: 600.0000 mg | ORAL_TABLET | Freq: Four times a day (QID) | ORAL | Status: DC | PRN

## 2017-04-18 MED ORDER — LACTATED RINGERS IV SOLN
INTRAVENOUS | Status: DC
  Administered 2017-04-18 (×2): via INTRAVENOUS

## 2017-04-18 MED ORDER — WITCH HAZEL-GLYCERIN EX PADS: 1.0000 "application " | MEDICATED_PAD | CUTANEOUS | Status: DC | PRN

## 2017-04-18 MED ORDER — COCONUT OIL OIL
1.0000 "application " | TOPICAL_OIL | Status: DC | PRN
  Administered 2017-04-19: 1 via TOPICAL
  Filled 2017-04-18: qty 120

## 2017-04-18 MED ORDER — DIPHENHYDRAMINE HCL 50 MG/ML IJ SOLN: 12.5000 mg | INTRAMUSCULAR | Status: DC | PRN

## 2017-04-18 MED ORDER — SENNOSIDES-DOCUSATE SODIUM 8.6-50 MG PO TABS
2.0000 | ORAL_TABLET | ORAL | Status: DC
  Administered 2017-04-18 – 2017-04-19 (×2): 2 via ORAL
  Filled 2017-04-18 (×2): qty 2

## 2017-04-18 MED ORDER — KETOROLAC TROMETHAMINE 30 MG/ML IJ SOLN
30.0000 mg | Freq: Four times a day (QID) | INTRAMUSCULAR | Status: AC | PRN
  Administered 2017-04-18: 30 mg via INTRAVENOUS
  Filled 2017-04-18: qty 1

## 2017-04-18 MED ORDER — ACETAMINOPHEN 500 MG PO TABS
1000.0000 mg | ORAL_TABLET | Freq: Four times a day (QID) | ORAL | Status: AC
  Administered 2017-04-18 (×2): 1000 mg via ORAL
  Filled 2017-04-18 (×2): qty 2

## 2017-04-18 MED ORDER — SIMETHICONE 80 MG PO CHEW
80.0000 mg | CHEWABLE_TABLET | ORAL | Status: DC
  Administered 2017-04-18 – 2017-04-19 (×2): 80 mg via ORAL
  Filled 2017-04-18 (×2): qty 1

## 2017-04-18 MED ORDER — ONDANSETRON HCL 4 MG/2ML IJ SOLN
4.0000 mg | Freq: Three times a day (TID) | INTRAMUSCULAR | Status: DC | PRN
  Administered 2017-04-18 – 2017-04-19 (×3): 4 mg via INTRAVENOUS
  Filled 2017-04-18 (×3): qty 2

## 2017-04-18 MED ORDER — ACETAMINOPHEN 325 MG PO TABS
650.0000 mg | ORAL_TABLET | ORAL | Status: DC | PRN
  Administered 2017-04-18 – 2017-04-20 (×6): 650 mg via ORAL
  Filled 2017-04-18 (×6): qty 2

## 2017-04-18 NOTE — Anesthesia Postprocedure Evaluation (Addendum)
Anesthesia Post Note  Patient: Pamela Larsen  Procedure(s) Performed: Procedure(s) (LRB): CESAREAN SECTION (N/A)  Patient location during evaluation: Mother Baby Anesthesia Type: Epidural Level of consciousness: awake and alert and oriented Pain management: pain level controlled Vital Signs Assessment: post-procedure vital signs reviewed and stable Respiratory status: spontaneous breathing and nonlabored ventilation Cardiovascular status: stable Postop Assessment: no headache, epidural receding, patient able to bend at knees, adequate PO intake, no backache and no signs of nausea or vomiting Anesthetic complications: no        Last Vitals:  Vitals:   04/18/17 0325 04/18/17 0521  BP: (!) 114/51 (!) 107/47  Pulse: 98 98  Resp: 18   Temp: 36.9 C     Last Pain:  Vitals:   04/18/17 0655  TempSrc:   PainSc: 2    Pain Goal: Patients Stated Pain Goal: 3 (04/18/17 0655)               Laban EmperorMalinova,Nataliya Hristova

## 2017-04-18 NOTE — Lactation Note (Addendum)
This note was copied from a baby's chart. Lactation Consultation Note  Patient Name: Pamela Larsen GuysOlivia Eskenazi GNFAO'ZToday's Date: 04/18/2017   Baby 13 hours old and sleeping STS on mother's chest after bath. Per mom she has attempted breastfeeding but he is too sleepy. Mom has my # to call for assist w/next feeding.  Mother called for assistance.  Baby cueing at 3914 hours old. Reviewed hand expression and mother has good flow of colostrum. Mother has areola edema causing semi flat nipples. Assisted w/ latching in football position and cross cradle. Baby sucked a few times and became sleepy. Oral assessment indicated short anterior lingual frenulum contributing to limited mobility. Spoon fed baby approx 9 ml of colostrum and he seemed content. Mom encouraged to feed baby 8-12 times/24 hours and with feeding cues.  Mom has my # to call for assist w/next feeding. Mom made aware of O/P services, breastfeeding support groups, community resources, and our phone # for post-discharge questions.        Maternal Data    Feeding    LATCH Score/Interventions                      Lactation Tools Discussed/Used     Consult Status      Hardie PulleyBerkelhammer, Ruth Boschen 04/18/2017, 10:33 AM

## 2017-04-18 NOTE — Progress Notes (Signed)
Subjective: Postpartum Day 1: Cesarean Delivery Patient reports nausea and tolerating PO.  Some nausea after ambulating to BR  Objective: Vital signs in last 24 hours: Temp:  [97.4 F (36.3 C)-99.5 F (37.5 C)] 98.4 F (36.9 C) (05/29 0325) Pulse Rate:  [74-117] 98 (05/29 0521) Resp:  [16-20] 18 (05/29 0325) BP: (84-145)/(47-119) 107/47 (05/29 0521) SpO2:  [95 %-100 %] 95 % (05/29 0325) Weight:  [225 lb (102.1 kg)-225 lb 0.6 oz (102.1 kg)] 225 lb (102.1 kg) (05/28 1234)  Physical Exam:  General: alert and cooperative Lochia: appropriate Uterine Fundus: firm Incision: healing well, small old drainage noted on R margin of bandage. DVT Evaluation: No evidence of DVT seen on physical exam. Negative Homan's sign. No cords or calf tenderness. Calf/Ankle edema is present. Foley with small blood tinged urine noited   Recent Labs  04/17/17 1215 04/18/17 0541  HGB 11.0* 9.3*  HCT 34.4* 28.3*    Assessment/Plan: Status post Cesarean section. Doing well postoperatively.  Continue current care  Desires circ prior to discharge.  Valyn Latchford G 04/18/2017, 8:18 AM

## 2017-04-19 LAB — RH IG WORKUP (INCLUDES ABO/RH)
ABO/RH(D): O NEG
Fetal Screen: NEGATIVE
GESTATIONAL AGE(WKS): 37.6
Unit division: 0

## 2017-04-19 NOTE — Lactation Note (Addendum)
This note was copied from a baby's chart. Lactation Consultation Note Mom has been leaking colostrum for a while before delivery. Hand expressed colostrum. Mom very sleepy, hurts to sit up straight from c/section, half way laying, hard to express colostrum, collect 3ml. Spoon fed baby. Rn stated baby fussy, cluster feeding, feeds 5 min, then sleeps. Explained normal, discussed ways to keep baby stimulated. Assisted in football position, worked on "C" hold for latching. Baby BF well 5 min. Then fell asleep.  Mom has flat nipples, very compressible. Baby has anterior frenulum. RN fitted mom w/#20 NS. Mom has shells, not wearing d/t in gown. Encouraged to wear this am. Mom had hand pump.  Mom shown how to use DEBP & how to disassemble, clean, & reassemble parts. Mom knows to pump q3h for 15-20 min. Or at least 5-6 times a day. Encouraged to hand express to give colostrum as supplement.  At 34 hrs of age, baby has had 12 voids and 5 stools. The first day didn't BF much, since baby has cluster fed.  Reported to RN. Discussed w/FOB and mom, mom very sleepy at the end of consult.     Patient Name: Pamela Larsen GuysOlivia Roediger WGNFA'OToday's Date: 04/19/2017 Reason for consult: Follow-up assessment;Infant weight loss   Maternal Data    Feeding Feeding Type: Breast Milk Length of feed: 5 min  LATCH Score/Interventions Latch: Repeated attempts needed to sustain latch, nipple held in mouth throughout feeding, stimulation needed to elicit sucking reflex. Intervention(s): Skin to skin;Waking techniques Intervention(s): Adjust position;Assist with latch;Breast massage;Breast compression  Audible Swallowing: A few with stimulation Intervention(s): Hand expression Intervention(s): Skin to skin;Hand expression;Alternate breast massage  Type of Nipple: Flat Intervention(s): Double electric pump;Shells;Hand pump  Comfort (Breast/Nipple): Soft / non-tender     Hold (Positioning): Full assist, staff holds infant at  breast Intervention(s): Support Pillows;Breastfeeding basics reviewed;Position options;Skin to skin  LATCH Score: 5  Lactation Tools Discussed/Used Tools: Shells;Nipple Dorris CarnesShields;Pump Nipple shield size: 20 Shell Type: Inverted Breast pump type: Double-Electric Breast Pump Pump Review: Setup, frequency, and cleaning;Milk Storage Initiated by:: Peri JeffersonL. Ananiah Maciolek RN IBCLC Date initiated:: 04/19/17   Consult Status Consult Status: Follow-up Date: 04/19/17 (In PM) Follow-up type: In-patient    Charyl DancerCARVER, Michiel Sivley G 04/19/2017, 6:52 AM

## 2017-04-19 NOTE — Lactation Note (Signed)
This note was copied from a baby's chart. Lactation Consultation Note  Patient Name: Boy Darlina GuysOlivia Velazquez ZOXWR'UToday's Date: 04/19/2017 Reason for consult: Follow-up assessment Baby at 43 hr of life. Baby has a 9% wt loss in life time, 9 bf, 8 wets, and 4 stools in the last 24 hr. At this visit baby was very sleepy. At most he would take 5-6 sucks at the breast before slipping off. Baby will not latch for more than 1 suck without the NS. Used a 5 Fr with 10 ml of manually expressed milk at the finger. Baby tolerated finger feeding well. Mom has DEBP and Harmony at the bedside. Mom will offer the breast q3hr on demand, baby will stay awake, with long bursts of sucking, and notable swallows for at least 10 minutes. She will post express and offer expressed milk per volume guidelines. Lactation will f/u with dyad at the next bf. Family understands that if mom is unable to expressed volume of milk per guidelines she will need to offer formula.   Maternal Data    Feeding Feeding Type: Breast Fed Length of feed: 10 min (on and off)  LATCH Score/Interventions Latch: Repeated attempts needed to sustain latch, nipple held in mouth throughout feeding, stimulation needed to elicit sucking reflex. Intervention(s): Waking techniques;Skin to skin Intervention(s): Adjust position;Breast compression;Breast massage  Audible Swallowing: A few with stimulation Intervention(s): Hand expression Intervention(s): Skin to skin;Hand expression  Type of Nipple: Everted at rest and after stimulation  Comfort (Breast/Nipple): Soft / non-tender     Hold (Positioning): Full assist, staff holds infant at breast Intervention(s): Position options;Support Pillows  LATCH Score: 6  Lactation Tools Discussed/Used Tools: Nipple Shields;49F feeding tube / Syringe Nipple shield size: 20   Consult Status Consult Status: Follow-up Date: 04/20/17 Follow-up type: In-patient    Rulon Eisenmengerlizabeth E Averill Pons 04/19/2017, 4:28  PM

## 2017-04-19 NOTE — Progress Notes (Signed)
Subjective: Postpartum Day 2: Cesarean Delivery Patient reports nausea, tolerating PO, + flatus and no problems voiding.    Objective: Vital signs in last 24 hours: Temp:  [97.9 F (36.6 C)-98.2 F (36.8 C)] 97.9 F (36.6 C) (05/30 0530) Pulse Rate:  [92-105] 92 (05/30 0530) Resp:  [18] 18 (05/30 0530) BP: (91-110)/(57-71) 105/57 (05/30 0530) SpO2:  [96 %-98 %] 98 % (05/29 2000)  Physical Exam:  General: alert and cooperative Lochia: appropriate Uterine Fundus: firm Incision: healing well, old drainage noted on honeycomb dressing DVT Evaluation: No evidence of DVT seen on physical exam. Negative Homan's sign. No cords or calf tenderness. Calf/Ankle edema is present.   Recent Labs  04/17/17 1215 04/18/17 0541  HGB 11.0* 9.3*  HCT 34.4* 28.3*    Assessment/Plan: Status post Cesarean section. Doing well postoperatively.  Continue current care.  Tyrell Seifer G 04/19/2017, 7:46 AM

## 2017-04-19 NOTE — Lactation Note (Signed)
This note was copied from a baby's chart. Lactation Consultation Note  Patient Name: Pamela Darlina GuysOlivia Larsen RUEAV'WToday's Date: 04/19/2017 Reason for consult: Follow-up assessment  LC follow up at 46 hours of age.  Mom reports feeding of 5mls at 1800 and baby remained sleepy, she then latched for 15 minutes at 1900 and has 5mls to give to baby now.   Mom has been using DEBP on one breast while baby is latched, LC encouraged mom to allow baby to feed and then use DEBP on both breasts to allow for additional stimulation to breast baby was feeding on due to use of NS.  Mom agreeable to plan.  LC encouraged mom to work on hand expression after pumping to increase volume expressed to give to baby as mom is not wanting to supplement with formula.  LC encouraged mom to wake baby for feedings every 2 1/2 hours to allow for 8 feedings/24 hours.  Mom to allow baby to feed on demand more frequently if baby is showing feeding cues.  Mom denies concerns with NS and reports seeing colostrum in it after feedings.     Maternal Data    Feeding Feeding Type: Breast Fed Length of feed: 10 min (on and off)  LATCH Score/Interventions Latch: Repeated attempts needed to sustain latch, nipple held in mouth throughout feeding, stimulation needed to elicit sucking reflex. Intervention(s): Waking techniques;Skin to skin Intervention(s): Adjust position;Breast compression;Breast massage  Audible Swallowing: A few with stimulation Intervention(s): Hand expression Intervention(s): Skin to skin;Hand expression  Type of Nipple: Everted at rest and after stimulation  Comfort (Breast/Nipple): Soft / non-tender     Hold (Positioning): Full assist, staff holds infant at breast Intervention(s): Position options;Support Pillows  LATCH Score: 6  Lactation Tools Discussed/Used Tools: Nipple Shields Nipple shield size: 20   Consult Status Consult Status: Follow-up Date: 04/20/17 Follow-up type: In-patient    Pamela RisenShoptaw,  Pamela Larsen 04/19/2017, 7:43 PM

## 2017-04-20 MED ORDER — IBUPROFEN 600 MG PO TABS: 600.0000 mg | ORAL_TABLET | Freq: Four times a day (QID) | ORAL | 0 refills | Status: AC | PRN

## 2017-04-20 MED ORDER — OXYCODONE HCL 5 MG PO TABS: 5.0000 mg | ORAL_TABLET | ORAL | 0 refills | Status: AC | PRN

## 2017-04-20 NOTE — Lactation Note (Signed)
This note was copied from a baby's chart. Lactation Consultation Note Baby had 11 % weight loss. LC DOES NOT recommend baby to be d/c home today. Worked w/parents on supplemental feeding. Mom hadn't wanted to use formula as supplement only BM. Mom wasn't able to supply amount of colostrum needed at hours of age. Discussed w/mom temporary until BM comes in. Mom agreed. Baby has anterior frenulum that may be fixed after d/c home. Mom is semi flat, not compressible enough for baby to maintain deep latch w/o NS, and causes pain as well. Mom wearing #20 NS. Encouraged mom post-pump for stimulation even if she doesn't see colostrum. After pumping to hand express.  Hand expressed 7 ml colostrum for next feeding. Mom is to give colostrum first in 5 Fr syring, then give formula in syring w/15fr tubing after finished colostrum. Encouraged strict I&O.  Reported to RN. Patient Name: Pamela Larsen ZOXWR'UToday's Date: 04/20/2017 Reason for consult: Follow-up assessment;Infant weight loss   Maternal Data    Feeding Feeding Type: Formula Length of feed: 20 min  LATCH Score/Interventions Latch: Repeated attempts needed to sustain latch, nipple held in mouth throughout feeding, stimulation needed to elicit sucking reflex. Intervention(s): Skin to skin;Teach feeding cues;Waking techniques Intervention(s): Adjust position;Assist with latch;Breast massage;Breast compression  Audible Swallowing: A few with stimulation Intervention(s): Skin to skin;Hand expression Intervention(s): Alternate breast massage  Type of Nipple: Everted at rest and after stimulation (very short shaft) Intervention(s): Shells;Hand pump;Double electric pump  Comfort (Breast/Nipple): Filling, red/small blisters or bruises, mild/mod discomfort  Problem noted: Mild/Moderate discomfort Interventions (Mild/moderate discomfort): Comfort gels;Breast shields;Post-pump;Hand expression;Hand massage  Hold (Positioning): Assistance needed to  correctly position infant at breast and maintain latch. Intervention(s): Breastfeeding basics reviewed;Support Pillows;Position options;Skin to skin  LATCH Score: 6  Lactation Tools Discussed/Used Tools: Shells;Nipple Shields;Pump;83F feeding tube / Syringe;Comfort gels Nipple shield size: 20 Shell Type: Inverted Breast pump type: Double-Electric Breast Pump   Consult Status Consult Status: Follow-up Date: 04/20/17 (in pm) Follow-up type: In-patient    Lorrayne Ismael, Diamond NickelLAURA G 04/20/2017, 7:22 AM

## 2017-04-20 NOTE — Discharge Summary (Signed)
Obstetric Discharge Summary Reason for Admission: onset of labor Prenatal Procedures: none Intrapartum Procedures: cesarean: low cervical, transverse Postpartum Procedures: none Complications-Operative and Postpartum: none Hemoglobin  Date Value Ref Range Status  04/18/2017 9.3 (L) 12.0 - 15.0 g/dL Final   HCT  Date Value Ref Range Status  04/18/2017 28.3 (L) 36.0 - 46.0 % Final    Physical Exam:  General: alert, cooperative and no distress Lochia: appropriate Uterine Fundus: firm Incision: healing well DVT Evaluation: No evidence of DVT seen on physical exam.  Discharge Diagnoses: Term Pregnancy-delivered  Discharge Information: Date: 04/20/2017 Activity: pelvic rest Diet: routine Medications: PNV, Ibuprofen and Percocet Condition: stable Instructions: refer to practice specific booklet Discharge to: home   Newborn Data: Live born female  Birth Weight: 8 lb 8.3 oz (3865 g) APGAR: 9, 9  Home with mother.  Lorell Thibodaux II,Genifer Lazenby E 04/20/2017, 8:23 AM

## 2017-04-20 NOTE — Lactation Note (Signed)
This note was copied from a baby's chart. Lactation Consultation Note: Infant is at 11% weight loss this am. Mother has continued to breastfeed using #20 nipple shield. Mother reports that she sees colostrum in the nipple shield. She then supplements infant with curved tip syringe or #5 fr feeding tube. Mother has 5-6 ml of ebm at bedside to offer infant with next feeding. Reviewed proper application of the nipple shield. Advised to get infant on the breast with a wide open mouth. Attempt to latch infant. Infant very sleepy. Advised parents to use supplemental guidelines at bedside.  Advised mother to continue to post pump after each feeding or every 2-3 hours for 15-20 mins. Mother has Medela breastpump.  Advised mother to do good breast massage and to apply ice for 15 mins every 3-4 hours. Mother was scheduled for a follow up appt with LC on July 6 at 11:30. Advised mother to phone with any breastfeeding questions or concerns. She is aware of available LC services. Mother plans Peds visit in am for weight check.  Patient Name: Boy Darlina GuysOlivia Blake WGNFA'OToday's Date: 04/20/2017 Reason for consult: Follow-up assessment   Maternal Data    Feeding Feeding Type: Breast Fed Length of feed:  (infant sleepy , no sustained latch)  LATCH Score/Interventions Latch: Repeated attempts needed to sustain latch, nipple held in mouth throughout feeding, stimulation needed to elicit sucking reflex. Intervention(s): Skin to skin;Teach feeding cues;Waking techniques Intervention(s): Adjust position;Assist with latch;Breast massage;Breast compression  Audible Swallowing: A few with stimulation Intervention(s): Skin to skin;Hand expression Intervention(s): Alternate breast massage  Type of Nipple: Everted at rest and after stimulation (very short shaft) Intervention(s): Shells;Hand pump;Double electric pump  Comfort (Breast/Nipple): Filling, red/small blisters or bruises, mild/mod discomfort  Problem noted:  Mild/Moderate discomfort Interventions (Mild/moderate discomfort): Comfort gels;Breast shields;Post-pump;Hand expression;Hand massage  Hold (Positioning): Assistance needed to correctly position infant at breast and maintain latch. Intervention(s): Breastfeeding basics reviewed;Support Pillows;Position options;Skin to skin  LATCH Score: 6  Lactation Tools Discussed/Used Tools: Nipple Shields Nipple shield size: 20 Shell Type: Inverted Breast pump type: Double-Electric Breast Pump   Consult Status Consult Status: Follow-up Date: 04/26/17 Follow-up type: Out-patient    Stevan BornKendrick, Katrinna Travieso [facility] 04/20/2017, 9:42 AM

## 2017-04-26 ENCOUNTER — Ambulatory Visit: Payer: Self-pay

## 2017-04-26 NOTE — Lactation Note (Signed)
This note was copied from a baby's chart. Lactation Consult  Mother's reason for visit:  Sore nipples, feeding assessment Visit Type:  Outpatient Appointment Notes:  58 day old infant. Oral assessment indicates short anterior lingual frenulum and short labial frenulum.  Baby has recessed chin and sucks bottom lip in.  Mother's nipples have abrasions on tips.  Weight, voids, stools WNL. Observed feeding and mother allows baby to self latch shallow with no guidance.  Encouraged her to compress breast for a deeper latch to help w/ nipple soreness.  Sucks and swallows observed.  Recommend APNO if needed call OB/GYN and continue to occasionally post pump and give baby back volume pumped.  Once per day or every other is sufficient.  Provided resources for frenulum assessment. Consult:  Initial Lactation Consultant:  Hardie Pulley  ________________________________________________________________________   ________________________________________________________________________  Mother's Name: Pamela Larsen Type of delivery:  C-Section, Low Transverse Breastfeeding Experience:  Primip Maternal Medical Conditions:  Thyroid (recommend she recheck at 6 weeks) Maternal Medications:  PNV, percocet, motrin  ________________________________________________________________________  Breastfeeding History (Post Discharge)  Frequency of breastfeeding:  q2-3 hours Duration of feeding:  20-40 min  Supplementation  Formula:  Volume not since Monday 60 ml Frequency:  Occasionally (but now only breastmilk) Total volume per day:  60ml       Brand: Similac  Pumping  Type of pump:  Unknown Frequency:  pRN Volume:  100-200 ml  Infant Intake and Output Assessment  Voids:  8 in 24 hrs.  Color:  Clear yellow Stools:  6 in 24 hrs.  Color:  Yellow  ________________________________________________________________________  Maternal Breast Assessment  Breast:  Soft Nipple:   Erect Pain level:  2 Pain interventions:  Cream/oil and Expressed breast milk  _______________________________________________________________________ Feeding Assessment/Evaluation  Initial feeding assessment:  Infant's oral assessment:  Variance  Positioning:  Football Left breast  LATCH documentation:  Latch:  2 = Grasps breast easily, tongue down, lips flanged, rhythmical sucking.  Audible swallowing:  1 = A few with stimulation  Type of nipple:  2 = Everted at rest and after stimulation  Comfort (Breast/Nipple):  1 = Filling, red/small blisters or bruises, mild/mod discomfort  Hold (Positioning):  1 = Assistance needed to correctly position infant at breast and maintain latch  LATCH score:  8 Attached assessment:  Shallow  Lips flanged:  No.  Lips untucked:  Yes.    Suck assessment:  Displays both  Tools:  Pump Instructed on use and cleaning of tool:  No.  Pre-feed weight:  3606 g  (7 lb. 15.2 oz.) Post-feed weight:  3642 g (8 lb. .05 oz.) Amount transferred:  12 ml Amount supplemented:  0 ml  Additional Feeding Assessment -   Infant's oral assessment:  Variance  Positioning:  Football Right breast  LATCH documentation:  Latch:  2 = Grasps breast easily, tongue down, lips flanged, rhythmical sucking.  Audible swallowing:  1 = A few with stimulation  Type of nipple:  2 = Everted at rest and after stimulation  Comfort (Breast/Nipple):  1 = Filling, red/small blisters or bruises, mild/mod discomfort  Hold (Positioning):  1 = Assistance needed to correctly position infant at breast and maintain latch  LATCH score:  8  Attached assessment:  Shallow  Lips flanged:  No.  Lips untucked:  Yes.    Suck assessment:  Displays both  Tools:  Shells Instructed on use and cleaning of tool:  Yes.    Pre-feed weight:  3642 g  Post-feed weight:  3674 g (8 lb. 1.6 oz.) Amount transferred:  32 ml Amount supplemented:  0 ml   Total amount transferred:  68 ml Total  supplement given:  0 ml

## 2017-04-28 NOTE — Addendum Note (Signed)
Addendum  created 04/28/17 1258 by Noble Cicalese, MD   Sign clinical note    

## 2017-05-29 DIAGNOSIS — Z1389 Encounter for screening for other disorder: Secondary | ICD-10-CM | POA: Diagnosis not present

## 2017-05-29 DIAGNOSIS — E039 Hypothyroidism, unspecified: Secondary | ICD-10-CM | POA: Diagnosis not present

## 2017-06-15 DIAGNOSIS — M2550 Pain in unspecified joint: Secondary | ICD-10-CM | POA: Diagnosis not present

## 2017-06-15 DIAGNOSIS — R748 Abnormal levels of other serum enzymes: Secondary | ICD-10-CM | POA: Diagnosis not present

## 2017-06-15 DIAGNOSIS — N809 Endometriosis, unspecified: Secondary | ICD-10-CM | POA: Diagnosis not present

## 2017-06-15 DIAGNOSIS — R5383 Other fatigue: Secondary | ICD-10-CM | POA: Diagnosis not present

## 2017-06-15 DIAGNOSIS — E2749 Other adrenocortical insufficiency: Secondary | ICD-10-CM | POA: Diagnosis not present

## 2017-06-15 DIAGNOSIS — D8989 Other specified disorders involving the immune mechanism, not elsewhere classified: Secondary | ICD-10-CM | POA: Diagnosis not present

## 2017-10-05 DIAGNOSIS — J069 Acute upper respiratory infection, unspecified: Secondary | ICD-10-CM | POA: Diagnosis not present

## 2017-11-13 DIAGNOSIS — Z13220 Encounter for screening for lipoid disorders: Secondary | ICD-10-CM | POA: Diagnosis not present

## 2017-11-13 DIAGNOSIS — Z131 Encounter for screening for diabetes mellitus: Secondary | ICD-10-CM | POA: Diagnosis not present

## 2017-11-13 DIAGNOSIS — Z0000 Encounter for general adult medical examination without abnormal findings: Secondary | ICD-10-CM | POA: Diagnosis not present

## 2017-11-22 DIAGNOSIS — R748 Abnormal levels of other serum enzymes: Secondary | ICD-10-CM | POA: Diagnosis not present

## 2017-11-22 DIAGNOSIS — K589 Irritable bowel syndrome without diarrhea: Secondary | ICD-10-CM | POA: Diagnosis not present

## 2017-11-22 DIAGNOSIS — R5383 Other fatigue: Secondary | ICD-10-CM | POA: Diagnosis not present

## 2017-11-22 DIAGNOSIS — D8989 Other specified disorders involving the immune mechanism, not elsewhere classified: Secondary | ICD-10-CM | POA: Diagnosis not present

## 2017-11-23 DIAGNOSIS — D8989 Other specified disorders involving the immune mechanism, not elsewhere classified: Secondary | ICD-10-CM | POA: Diagnosis not present

## 2017-11-23 DIAGNOSIS — A6920 Lyme disease, unspecified: Secondary | ICD-10-CM | POA: Diagnosis not present

## 2017-11-23 DIAGNOSIS — E349 Endocrine disorder, unspecified: Secondary | ICD-10-CM | POA: Diagnosis not present

## 2017-11-23 DIAGNOSIS — K589 Irritable bowel syndrome without diarrhea: Secondary | ICD-10-CM | POA: Diagnosis not present

## 2017-11-23 DIAGNOSIS — E559 Vitamin D deficiency, unspecified: Secondary | ICD-10-CM | POA: Diagnosis not present

## 2017-11-23 DIAGNOSIS — R748 Abnormal levels of other serum enzymes: Secondary | ICD-10-CM | POA: Diagnosis not present

## 2017-11-23 DIAGNOSIS — R5383 Other fatigue: Secondary | ICD-10-CM | POA: Diagnosis not present

## 2018-01-15 DIAGNOSIS — R748 Abnormal levels of other serum enzymes: Secondary | ICD-10-CM | POA: Diagnosis not present

## 2018-01-24 DIAGNOSIS — R5383 Other fatigue: Secondary | ICD-10-CM | POA: Diagnosis not present

## 2018-01-24 DIAGNOSIS — F410 Panic disorder [episodic paroxysmal anxiety] without agoraphobia: Secondary | ICD-10-CM | POA: Diagnosis not present

## 2018-04-23 DIAGNOSIS — I951 Orthostatic hypotension: Secondary | ICD-10-CM | POA: Diagnosis not present

## 2018-04-23 DIAGNOSIS — R5383 Other fatigue: Secondary | ICD-10-CM | POA: Diagnosis not present

## 2018-04-23 DIAGNOSIS — K589 Irritable bowel syndrome without diarrhea: Secondary | ICD-10-CM | POA: Diagnosis not present

## 2018-04-23 DIAGNOSIS — E559 Vitamin D deficiency, unspecified: Secondary | ICD-10-CM | POA: Diagnosis not present

## 2018-06-13 DIAGNOSIS — F4322 Adjustment disorder with anxiety: Secondary | ICD-10-CM | POA: Diagnosis not present

## 2018-06-27 DIAGNOSIS — F4322 Adjustment disorder with anxiety: Secondary | ICD-10-CM | POA: Diagnosis not present

## 2018-06-27 DIAGNOSIS — M546 Pain in thoracic spine: Secondary | ICD-10-CM | POA: Diagnosis not present

## 2018-06-27 DIAGNOSIS — M9901 Segmental and somatic dysfunction of cervical region: Secondary | ICD-10-CM | POA: Diagnosis not present

## 2018-06-27 DIAGNOSIS — M9902 Segmental and somatic dysfunction of thoracic region: Secondary | ICD-10-CM | POA: Diagnosis not present

## 2018-06-27 DIAGNOSIS — M542 Cervicalgia: Secondary | ICD-10-CM | POA: Diagnosis not present

## 2018-06-30 DIAGNOSIS — M9901 Segmental and somatic dysfunction of cervical region: Secondary | ICD-10-CM | POA: Diagnosis not present

## 2018-06-30 DIAGNOSIS — M9902 Segmental and somatic dysfunction of thoracic region: Secondary | ICD-10-CM | POA: Diagnosis not present

## 2018-06-30 DIAGNOSIS — M542 Cervicalgia: Secondary | ICD-10-CM | POA: Diagnosis not present

## 2018-06-30 DIAGNOSIS — M546 Pain in thoracic spine: Secondary | ICD-10-CM | POA: Diagnosis not present

## 2018-07-02 DIAGNOSIS — M9901 Segmental and somatic dysfunction of cervical region: Secondary | ICD-10-CM | POA: Diagnosis not present

## 2018-07-02 DIAGNOSIS — M546 Pain in thoracic spine: Secondary | ICD-10-CM | POA: Diagnosis not present

## 2018-07-02 DIAGNOSIS — M542 Cervicalgia: Secondary | ICD-10-CM | POA: Diagnosis not present

## 2018-07-02 DIAGNOSIS — M9902 Segmental and somatic dysfunction of thoracic region: Secondary | ICD-10-CM | POA: Diagnosis not present

## 2018-07-03 DIAGNOSIS — M9901 Segmental and somatic dysfunction of cervical region: Secondary | ICD-10-CM | POA: Diagnosis not present

## 2018-07-03 DIAGNOSIS — M546 Pain in thoracic spine: Secondary | ICD-10-CM | POA: Diagnosis not present

## 2018-07-03 DIAGNOSIS — M9902 Segmental and somatic dysfunction of thoracic region: Secondary | ICD-10-CM | POA: Diagnosis not present

## 2018-07-03 DIAGNOSIS — M542 Cervicalgia: Secondary | ICD-10-CM | POA: Diagnosis not present

## 2018-07-04 DIAGNOSIS — F4322 Adjustment disorder with anxiety: Secondary | ICD-10-CM | POA: Diagnosis not present

## 2018-07-05 DIAGNOSIS — M542 Cervicalgia: Secondary | ICD-10-CM | POA: Diagnosis not present

## 2018-07-05 DIAGNOSIS — M9902 Segmental and somatic dysfunction of thoracic region: Secondary | ICD-10-CM | POA: Diagnosis not present

## 2018-07-05 DIAGNOSIS — M9901 Segmental and somatic dysfunction of cervical region: Secondary | ICD-10-CM | POA: Diagnosis not present

## 2018-07-05 DIAGNOSIS — M546 Pain in thoracic spine: Secondary | ICD-10-CM | POA: Diagnosis not present

## 2018-07-09 DIAGNOSIS — M9901 Segmental and somatic dysfunction of cervical region: Secondary | ICD-10-CM | POA: Diagnosis not present

## 2018-07-09 DIAGNOSIS — M9902 Segmental and somatic dysfunction of thoracic region: Secondary | ICD-10-CM | POA: Diagnosis not present

## 2018-07-09 DIAGNOSIS — M542 Cervicalgia: Secondary | ICD-10-CM | POA: Diagnosis not present

## 2018-07-09 DIAGNOSIS — M546 Pain in thoracic spine: Secondary | ICD-10-CM | POA: Diagnosis not present

## 2018-07-11 DIAGNOSIS — M542 Cervicalgia: Secondary | ICD-10-CM | POA: Diagnosis not present

## 2018-07-11 DIAGNOSIS — M9902 Segmental and somatic dysfunction of thoracic region: Secondary | ICD-10-CM | POA: Diagnosis not present

## 2018-07-11 DIAGNOSIS — M9901 Segmental and somatic dysfunction of cervical region: Secondary | ICD-10-CM | POA: Diagnosis not present

## 2018-07-11 DIAGNOSIS — M546 Pain in thoracic spine: Secondary | ICD-10-CM | POA: Diagnosis not present

## 2018-07-12 DIAGNOSIS — M542 Cervicalgia: Secondary | ICD-10-CM | POA: Diagnosis not present

## 2018-07-12 DIAGNOSIS — M9901 Segmental and somatic dysfunction of cervical region: Secondary | ICD-10-CM | POA: Diagnosis not present

## 2018-07-12 DIAGNOSIS — M9902 Segmental and somatic dysfunction of thoracic region: Secondary | ICD-10-CM | POA: Diagnosis not present

## 2018-07-12 DIAGNOSIS — M546 Pain in thoracic spine: Secondary | ICD-10-CM | POA: Diagnosis not present

## 2018-07-16 DIAGNOSIS — M546 Pain in thoracic spine: Secondary | ICD-10-CM | POA: Diagnosis not present

## 2018-07-16 DIAGNOSIS — M542 Cervicalgia: Secondary | ICD-10-CM | POA: Diagnosis not present

## 2018-07-16 DIAGNOSIS — M9902 Segmental and somatic dysfunction of thoracic region: Secondary | ICD-10-CM | POA: Diagnosis not present

## 2018-07-16 DIAGNOSIS — M9901 Segmental and somatic dysfunction of cervical region: Secondary | ICD-10-CM | POA: Diagnosis not present

## 2018-07-17 DIAGNOSIS — M542 Cervicalgia: Secondary | ICD-10-CM | POA: Diagnosis not present

## 2018-07-17 DIAGNOSIS — M9902 Segmental and somatic dysfunction of thoracic region: Secondary | ICD-10-CM | POA: Diagnosis not present

## 2018-07-17 DIAGNOSIS — M546 Pain in thoracic spine: Secondary | ICD-10-CM | POA: Diagnosis not present

## 2018-07-17 DIAGNOSIS — M9901 Segmental and somatic dysfunction of cervical region: Secondary | ICD-10-CM | POA: Diagnosis not present

## 2018-07-18 DIAGNOSIS — F4322 Adjustment disorder with anxiety: Secondary | ICD-10-CM | POA: Diagnosis not present

## 2018-07-19 DIAGNOSIS — M9901 Segmental and somatic dysfunction of cervical region: Secondary | ICD-10-CM | POA: Diagnosis not present

## 2018-07-19 DIAGNOSIS — M542 Cervicalgia: Secondary | ICD-10-CM | POA: Diagnosis not present

## 2018-07-19 DIAGNOSIS — M546 Pain in thoracic spine: Secondary | ICD-10-CM | POA: Diagnosis not present

## 2018-07-19 DIAGNOSIS — M9902 Segmental and somatic dysfunction of thoracic region: Secondary | ICD-10-CM | POA: Diagnosis not present

## 2018-07-24 DIAGNOSIS — M542 Cervicalgia: Secondary | ICD-10-CM | POA: Diagnosis not present

## 2018-07-24 DIAGNOSIS — M9902 Segmental and somatic dysfunction of thoracic region: Secondary | ICD-10-CM | POA: Diagnosis not present

## 2018-07-24 DIAGNOSIS — M546 Pain in thoracic spine: Secondary | ICD-10-CM | POA: Diagnosis not present

## 2018-07-24 DIAGNOSIS — M9901 Segmental and somatic dysfunction of cervical region: Secondary | ICD-10-CM | POA: Diagnosis not present

## 2018-07-25 DIAGNOSIS — M546 Pain in thoracic spine: Secondary | ICD-10-CM | POA: Diagnosis not present

## 2018-07-25 DIAGNOSIS — M542 Cervicalgia: Secondary | ICD-10-CM | POA: Diagnosis not present

## 2018-07-25 DIAGNOSIS — M9901 Segmental and somatic dysfunction of cervical region: Secondary | ICD-10-CM | POA: Diagnosis not present

## 2018-07-25 DIAGNOSIS — M9902 Segmental and somatic dysfunction of thoracic region: Secondary | ICD-10-CM | POA: Diagnosis not present

## 2018-07-25 DIAGNOSIS — F4322 Adjustment disorder with anxiety: Secondary | ICD-10-CM | POA: Diagnosis not present

## 2018-07-26 DIAGNOSIS — M9902 Segmental and somatic dysfunction of thoracic region: Secondary | ICD-10-CM | POA: Diagnosis not present

## 2018-07-26 DIAGNOSIS — M546 Pain in thoracic spine: Secondary | ICD-10-CM | POA: Diagnosis not present

## 2018-07-26 DIAGNOSIS — M542 Cervicalgia: Secondary | ICD-10-CM | POA: Diagnosis not present

## 2018-07-26 DIAGNOSIS — M9901 Segmental and somatic dysfunction of cervical region: Secondary | ICD-10-CM | POA: Diagnosis not present

## 2018-07-27 DIAGNOSIS — R5383 Other fatigue: Secondary | ICD-10-CM | POA: Diagnosis not present

## 2018-07-27 DIAGNOSIS — Z713 Dietary counseling and surveillance: Secondary | ICD-10-CM | POA: Diagnosis not present

## 2018-07-27 DIAGNOSIS — K5900 Constipation, unspecified: Secondary | ICD-10-CM | POA: Diagnosis not present

## 2018-07-27 DIAGNOSIS — E039 Hypothyroidism, unspecified: Secondary | ICD-10-CM | POA: Diagnosis not present

## 2018-07-30 DIAGNOSIS — M542 Cervicalgia: Secondary | ICD-10-CM | POA: Diagnosis not present

## 2018-07-30 DIAGNOSIS — M9901 Segmental and somatic dysfunction of cervical region: Secondary | ICD-10-CM | POA: Diagnosis not present

## 2018-07-30 DIAGNOSIS — M9902 Segmental and somatic dysfunction of thoracic region: Secondary | ICD-10-CM | POA: Diagnosis not present

## 2018-07-30 DIAGNOSIS — M546 Pain in thoracic spine: Secondary | ICD-10-CM | POA: Diagnosis not present

## 2018-08-01 DIAGNOSIS — F4322 Adjustment disorder with anxiety: Secondary | ICD-10-CM | POA: Diagnosis not present

## 2018-08-02 DIAGNOSIS — M542 Cervicalgia: Secondary | ICD-10-CM | POA: Diagnosis not present

## 2018-08-02 DIAGNOSIS — M546 Pain in thoracic spine: Secondary | ICD-10-CM | POA: Diagnosis not present

## 2018-08-02 DIAGNOSIS — M9901 Segmental and somatic dysfunction of cervical region: Secondary | ICD-10-CM | POA: Diagnosis not present

## 2018-08-02 DIAGNOSIS — M9902 Segmental and somatic dysfunction of thoracic region: Secondary | ICD-10-CM | POA: Diagnosis not present

## 2018-08-07 DIAGNOSIS — M546 Pain in thoracic spine: Secondary | ICD-10-CM | POA: Diagnosis not present

## 2018-08-07 DIAGNOSIS — M542 Cervicalgia: Secondary | ICD-10-CM | POA: Diagnosis not present

## 2018-08-07 DIAGNOSIS — M9901 Segmental and somatic dysfunction of cervical region: Secondary | ICD-10-CM | POA: Diagnosis not present

## 2018-08-07 DIAGNOSIS — M9902 Segmental and somatic dysfunction of thoracic region: Secondary | ICD-10-CM | POA: Diagnosis not present

## 2018-08-09 DIAGNOSIS — M9901 Segmental and somatic dysfunction of cervical region: Secondary | ICD-10-CM | POA: Diagnosis not present

## 2018-08-09 DIAGNOSIS — M9902 Segmental and somatic dysfunction of thoracic region: Secondary | ICD-10-CM | POA: Diagnosis not present

## 2018-08-09 DIAGNOSIS — M542 Cervicalgia: Secondary | ICD-10-CM | POA: Diagnosis not present

## 2018-08-09 DIAGNOSIS — M546 Pain in thoracic spine: Secondary | ICD-10-CM | POA: Diagnosis not present

## 2018-08-10 DIAGNOSIS — F4322 Adjustment disorder with anxiety: Secondary | ICD-10-CM | POA: Diagnosis not present

## 2018-08-13 DIAGNOSIS — M9902 Segmental and somatic dysfunction of thoracic region: Secondary | ICD-10-CM | POA: Diagnosis not present

## 2018-08-13 DIAGNOSIS — M542 Cervicalgia: Secondary | ICD-10-CM | POA: Diagnosis not present

## 2018-08-13 DIAGNOSIS — M546 Pain in thoracic spine: Secondary | ICD-10-CM | POA: Diagnosis not present

## 2018-08-13 DIAGNOSIS — M9901 Segmental and somatic dysfunction of cervical region: Secondary | ICD-10-CM | POA: Diagnosis not present

## 2018-08-22 DIAGNOSIS — M542 Cervicalgia: Secondary | ICD-10-CM | POA: Diagnosis not present

## 2018-08-22 DIAGNOSIS — M546 Pain in thoracic spine: Secondary | ICD-10-CM | POA: Diagnosis not present

## 2018-08-22 DIAGNOSIS — M9902 Segmental and somatic dysfunction of thoracic region: Secondary | ICD-10-CM | POA: Diagnosis not present

## 2018-08-22 DIAGNOSIS — M9901 Segmental and somatic dysfunction of cervical region: Secondary | ICD-10-CM | POA: Diagnosis not present

## 2018-08-22 DIAGNOSIS — F4322 Adjustment disorder with anxiety: Secondary | ICD-10-CM | POA: Diagnosis not present

## 2018-10-05 DIAGNOSIS — F4322 Adjustment disorder with anxiety: Secondary | ICD-10-CM | POA: Diagnosis not present

## 2018-10-12 DIAGNOSIS — F4322 Adjustment disorder with anxiety: Secondary | ICD-10-CM | POA: Diagnosis not present

## 2018-10-19 DIAGNOSIS — F4322 Adjustment disorder with anxiety: Secondary | ICD-10-CM | POA: Diagnosis not present

## 2018-10-26 DIAGNOSIS — F4322 Adjustment disorder with anxiety: Secondary | ICD-10-CM | POA: Diagnosis not present

## 2018-10-31 DIAGNOSIS — Z131 Encounter for screening for diabetes mellitus: Secondary | ICD-10-CM | POA: Diagnosis not present

## 2018-10-31 DIAGNOSIS — Z13220 Encounter for screening for lipoid disorders: Secondary | ICD-10-CM | POA: Diagnosis not present

## 2018-10-31 DIAGNOSIS — Z0000 Encounter for general adult medical examination without abnormal findings: Secondary | ICD-10-CM | POA: Diagnosis not present

## 2018-11-07 DIAGNOSIS — F4322 Adjustment disorder with anxiety: Secondary | ICD-10-CM | POA: Diagnosis not present

## 2018-12-14 DIAGNOSIS — F4322 Adjustment disorder with anxiety: Secondary | ICD-10-CM | POA: Diagnosis not present

## 2018-12-19 DIAGNOSIS — F4322 Adjustment disorder with anxiety: Secondary | ICD-10-CM | POA: Diagnosis not present

## 2018-12-24 DIAGNOSIS — M546 Pain in thoracic spine: Secondary | ICD-10-CM | POA: Diagnosis not present

## 2018-12-24 DIAGNOSIS — M542 Cervicalgia: Secondary | ICD-10-CM | POA: Diagnosis not present

## 2018-12-24 DIAGNOSIS — M9902 Segmental and somatic dysfunction of thoracic region: Secondary | ICD-10-CM | POA: Diagnosis not present

## 2018-12-24 DIAGNOSIS — M9901 Segmental and somatic dysfunction of cervical region: Secondary | ICD-10-CM | POA: Diagnosis not present

## 2018-12-28 DIAGNOSIS — F4322 Adjustment disorder with anxiety: Secondary | ICD-10-CM | POA: Diagnosis not present

## 2019-01-09 DIAGNOSIS — M546 Pain in thoracic spine: Secondary | ICD-10-CM | POA: Diagnosis not present

## 2019-01-09 DIAGNOSIS — M9901 Segmental and somatic dysfunction of cervical region: Secondary | ICD-10-CM | POA: Diagnosis not present

## 2019-01-09 DIAGNOSIS — M9902 Segmental and somatic dysfunction of thoracic region: Secondary | ICD-10-CM | POA: Diagnosis not present

## 2019-01-09 DIAGNOSIS — M542 Cervicalgia: Secondary | ICD-10-CM | POA: Diagnosis not present

## 2019-01-21 DIAGNOSIS — M9901 Segmental and somatic dysfunction of cervical region: Secondary | ICD-10-CM | POA: Diagnosis not present

## 2019-01-21 DIAGNOSIS — M546 Pain in thoracic spine: Secondary | ICD-10-CM | POA: Diagnosis not present

## 2019-01-21 DIAGNOSIS — M9902 Segmental and somatic dysfunction of thoracic region: Secondary | ICD-10-CM | POA: Diagnosis not present

## 2019-01-21 DIAGNOSIS — M542 Cervicalgia: Secondary | ICD-10-CM | POA: Diagnosis not present

## 2019-02-26 DIAGNOSIS — F4322 Adjustment disorder with anxiety: Secondary | ICD-10-CM | POA: Diagnosis not present

## 2019-03-18 DIAGNOSIS — R4789 Other speech disturbances: Secondary | ICD-10-CM | POA: Diagnosis not present

## 2019-03-18 DIAGNOSIS — E049 Nontoxic goiter, unspecified: Secondary | ICD-10-CM | POA: Diagnosis not present

## 2019-03-18 DIAGNOSIS — B2790 Infectious mononucleosis, unspecified without complication: Secondary | ICD-10-CM | POA: Diagnosis not present

## 2019-03-18 DIAGNOSIS — E039 Hypothyroidism, unspecified: Secondary | ICD-10-CM | POA: Diagnosis not present

## 2019-03-18 DIAGNOSIS — R5383 Other fatigue: Secondary | ICD-10-CM | POA: Diagnosis not present

## 2019-03-20 DIAGNOSIS — M9902 Segmental and somatic dysfunction of thoracic region: Secondary | ICD-10-CM | POA: Diagnosis not present

## 2019-03-20 DIAGNOSIS — M542 Cervicalgia: Secondary | ICD-10-CM | POA: Diagnosis not present

## 2019-03-20 DIAGNOSIS — M546 Pain in thoracic spine: Secondary | ICD-10-CM | POA: Diagnosis not present

## 2019-03-20 DIAGNOSIS — M9901 Segmental and somatic dysfunction of cervical region: Secondary | ICD-10-CM | POA: Diagnosis not present

## 2019-03-20 DIAGNOSIS — F4322 Adjustment disorder with anxiety: Secondary | ICD-10-CM | POA: Diagnosis not present

## 2019-03-26 DIAGNOSIS — H5711 Ocular pain, right eye: Secondary | ICD-10-CM | POA: Diagnosis not present

## 2019-03-26 DIAGNOSIS — F410 Panic disorder [episodic paroxysmal anxiety] without agoraphobia: Secondary | ICD-10-CM | POA: Diagnosis not present

## 2019-04-04 DIAGNOSIS — F4322 Adjustment disorder with anxiety: Secondary | ICD-10-CM | POA: Diagnosis not present

## 2019-04-23 DIAGNOSIS — F4322 Adjustment disorder with anxiety: Secondary | ICD-10-CM | POA: Diagnosis not present

## 2019-04-25 DIAGNOSIS — M546 Pain in thoracic spine: Secondary | ICD-10-CM | POA: Diagnosis not present

## 2019-04-25 DIAGNOSIS — M542 Cervicalgia: Secondary | ICD-10-CM | POA: Diagnosis not present

## 2019-04-25 DIAGNOSIS — M9902 Segmental and somatic dysfunction of thoracic region: Secondary | ICD-10-CM | POA: Diagnosis not present

## 2019-04-25 DIAGNOSIS — M9901 Segmental and somatic dysfunction of cervical region: Secondary | ICD-10-CM | POA: Diagnosis not present

## 2019-04-29 DIAGNOSIS — E539 Vitamin B deficiency, unspecified: Secondary | ICD-10-CM | POA: Diagnosis not present

## 2019-04-29 DIAGNOSIS — E559 Vitamin D deficiency, unspecified: Secondary | ICD-10-CM | POA: Diagnosis not present

## 2019-04-29 DIAGNOSIS — A6920 Lyme disease, unspecified: Secondary | ICD-10-CM | POA: Diagnosis not present

## 2019-04-29 DIAGNOSIS — R5383 Other fatigue: Secondary | ICD-10-CM | POA: Diagnosis not present

## 2019-05-01 DIAGNOSIS — M9901 Segmental and somatic dysfunction of cervical region: Secondary | ICD-10-CM | POA: Diagnosis not present

## 2019-05-01 DIAGNOSIS — M546 Pain in thoracic spine: Secondary | ICD-10-CM | POA: Diagnosis not present

## 2019-05-01 DIAGNOSIS — M542 Cervicalgia: Secondary | ICD-10-CM | POA: Diagnosis not present

## 2019-05-01 DIAGNOSIS — M9902 Segmental and somatic dysfunction of thoracic region: Secondary | ICD-10-CM | POA: Diagnosis not present

## 2019-05-07 DIAGNOSIS — M9901 Segmental and somatic dysfunction of cervical region: Secondary | ICD-10-CM | POA: Diagnosis not present

## 2019-05-07 DIAGNOSIS — M542 Cervicalgia: Secondary | ICD-10-CM | POA: Diagnosis not present

## 2019-05-07 DIAGNOSIS — M9902 Segmental and somatic dysfunction of thoracic region: Secondary | ICD-10-CM | POA: Diagnosis not present

## 2019-05-07 DIAGNOSIS — M546 Pain in thoracic spine: Secondary | ICD-10-CM | POA: Diagnosis not present

## 2019-06-14 DIAGNOSIS — F4322 Adjustment disorder with anxiety: Secondary | ICD-10-CM | POA: Diagnosis not present

## 2019-07-10 DIAGNOSIS — F4322 Adjustment disorder with anxiety: Secondary | ICD-10-CM | POA: Diagnosis not present

## 2019-08-06 DIAGNOSIS — F4322 Adjustment disorder with anxiety: Secondary | ICD-10-CM | POA: Diagnosis not present

## 2019-08-20 DIAGNOSIS — G44209 Tension-type headache, unspecified, not intractable: Secondary | ICD-10-CM | POA: Diagnosis not present

## 2019-08-20 DIAGNOSIS — M2669 Other specified disorders of temporomandibular joint: Secondary | ICD-10-CM | POA: Diagnosis not present

## 2019-08-20 DIAGNOSIS — G43909 Migraine, unspecified, not intractable, without status migrainosus: Secondary | ICD-10-CM | POA: Diagnosis not present

## 2019-08-30 DIAGNOSIS — F4322 Adjustment disorder with anxiety: Secondary | ICD-10-CM | POA: Diagnosis not present

## 2019-09-09 DIAGNOSIS — M5411 Radiculopathy, occipito-atlanto-axial region: Secondary | ICD-10-CM | POA: Diagnosis not present

## 2019-09-09 DIAGNOSIS — M9903 Segmental and somatic dysfunction of lumbar region: Secondary | ICD-10-CM | POA: Diagnosis not present

## 2019-09-09 DIAGNOSIS — M9901 Segmental and somatic dysfunction of cervical region: Secondary | ICD-10-CM | POA: Diagnosis not present

## 2019-09-10 DIAGNOSIS — M9903 Segmental and somatic dysfunction of lumbar region: Secondary | ICD-10-CM | POA: Diagnosis not present

## 2019-09-10 DIAGNOSIS — M5411 Radiculopathy, occipito-atlanto-axial region: Secondary | ICD-10-CM | POA: Diagnosis not present

## 2019-09-10 DIAGNOSIS — M9901 Segmental and somatic dysfunction of cervical region: Secondary | ICD-10-CM | POA: Diagnosis not present

## 2019-09-12 DIAGNOSIS — M9901 Segmental and somatic dysfunction of cervical region: Secondary | ICD-10-CM | POA: Diagnosis not present

## 2019-09-12 DIAGNOSIS — M9903 Segmental and somatic dysfunction of lumbar region: Secondary | ICD-10-CM | POA: Diagnosis not present

## 2019-09-12 DIAGNOSIS — M5411 Radiculopathy, occipito-atlanto-axial region: Secondary | ICD-10-CM | POA: Diagnosis not present

## 2019-09-26 DIAGNOSIS — F4322 Adjustment disorder with anxiety: Secondary | ICD-10-CM | POA: Diagnosis not present

## 2019-10-11 DIAGNOSIS — Z20818 Contact with and (suspected) exposure to other bacterial communicable diseases: Secondary | ICD-10-CM | POA: Diagnosis not present

## 2019-10-11 DIAGNOSIS — J029 Acute pharyngitis, unspecified: Secondary | ICD-10-CM | POA: Diagnosis not present

## 2019-10-22 DIAGNOSIS — R05 Cough: Secondary | ICD-10-CM | POA: Diagnosis not present

## 2019-10-22 DIAGNOSIS — J029 Acute pharyngitis, unspecified: Secondary | ICD-10-CM | POA: Diagnosis not present

## 2019-11-05 DIAGNOSIS — G43909 Migraine, unspecified, not intractable, without status migrainosus: Secondary | ICD-10-CM | POA: Diagnosis not present

## 2019-11-05 DIAGNOSIS — F419 Anxiety disorder, unspecified: Secondary | ICD-10-CM | POA: Diagnosis not present

## 2019-11-05 DIAGNOSIS — G44209 Tension-type headache, unspecified, not intractable: Secondary | ICD-10-CM | POA: Diagnosis not present

## 2019-11-05 DIAGNOSIS — N809 Endometriosis, unspecified: Secondary | ICD-10-CM | POA: Diagnosis not present

## 2019-11-06 DIAGNOSIS — F4322 Adjustment disorder with anxiety: Secondary | ICD-10-CM | POA: Diagnosis not present

## 2019-11-06 DIAGNOSIS — Z0000 Encounter for general adult medical examination without abnormal findings: Secondary | ICD-10-CM | POA: Diagnosis not present

## 2019-12-31 DIAGNOSIS — F4322 Adjustment disorder with anxiety: Secondary | ICD-10-CM | POA: Diagnosis not present

## 2020-01-03 DIAGNOSIS — G44209 Tension-type headache, unspecified, not intractable: Secondary | ICD-10-CM | POA: Diagnosis not present

## 2020-01-03 DIAGNOSIS — G43909 Migraine, unspecified, not intractable, without status migrainosus: Secondary | ICD-10-CM | POA: Diagnosis not present

## 2020-01-10 DIAGNOSIS — Z77120 Contact with and (suspected) exposure to mold (toxic): Secondary | ICD-10-CM | POA: Diagnosis not present

## 2020-01-10 DIAGNOSIS — R5383 Other fatigue: Secondary | ICD-10-CM | POA: Diagnosis not present

## 2020-01-10 DIAGNOSIS — R9089 Other abnormal findings on diagnostic imaging of central nervous system: Secondary | ICD-10-CM | POA: Diagnosis not present

## 2020-01-15 DIAGNOSIS — R300 Dysuria: Secondary | ICD-10-CM | POA: Diagnosis not present

## 2020-01-28 DIAGNOSIS — Z77120 Contact with and (suspected) exposure to mold (toxic): Secondary | ICD-10-CM | POA: Diagnosis not present

## 2020-01-28 DIAGNOSIS — G44209 Tension-type headache, unspecified, not intractable: Secondary | ICD-10-CM | POA: Diagnosis not present

## 2020-01-28 DIAGNOSIS — G43909 Migraine, unspecified, not intractable, without status migrainosus: Secondary | ICD-10-CM | POA: Diagnosis not present

## 2020-01-28 DIAGNOSIS — A6920 Lyme disease, unspecified: Secondary | ICD-10-CM | POA: Diagnosis not present

## 2020-01-31 DIAGNOSIS — N390 Urinary tract infection, site not specified: Secondary | ICD-10-CM | POA: Diagnosis not present

## 2020-02-04 DIAGNOSIS — F4322 Adjustment disorder with anxiety: Secondary | ICD-10-CM | POA: Diagnosis not present

## 2020-02-18 DIAGNOSIS — F4322 Adjustment disorder with anxiety: Secondary | ICD-10-CM | POA: Diagnosis not present

## 2020-02-27 DIAGNOSIS — F4322 Adjustment disorder with anxiety: Secondary | ICD-10-CM | POA: Diagnosis not present

## 2020-03-03 DIAGNOSIS — F4322 Adjustment disorder with anxiety: Secondary | ICD-10-CM | POA: Diagnosis not present

## 2020-04-21 DIAGNOSIS — F4322 Adjustment disorder with anxiety: Secondary | ICD-10-CM | POA: Diagnosis not present

## 2020-04-29 DIAGNOSIS — F4322 Adjustment disorder with anxiety: Secondary | ICD-10-CM | POA: Diagnosis not present

## 2020-05-08 DIAGNOSIS — F4322 Adjustment disorder with anxiety: Secondary | ICD-10-CM | POA: Diagnosis not present

## 2020-05-12 DIAGNOSIS — F4322 Adjustment disorder with anxiety: Secondary | ICD-10-CM | POA: Diagnosis not present

## 2020-05-19 DIAGNOSIS — F4322 Adjustment disorder with anxiety: Secondary | ICD-10-CM | POA: Diagnosis not present

## 2020-06-02 DIAGNOSIS — F4322 Adjustment disorder with anxiety: Secondary | ICD-10-CM | POA: Diagnosis not present
